# Patient Record
Sex: Female | Born: 1935
Health system: Southern US, Community
[De-identification: ages and names within clinical notes are randomized; demographics above are authoritative.]

## PROBLEM LIST (undated history)

## (undated) DIAGNOSIS — E785 Hyperlipidemia, unspecified: Secondary | ICD-10-CM

## (undated) DIAGNOSIS — K449 Diaphragmatic hernia without obstruction or gangrene: Secondary | ICD-10-CM

## (undated) DIAGNOSIS — M1990 Unspecified osteoarthritis, unspecified site: Secondary | ICD-10-CM

## (undated) DIAGNOSIS — K219 Gastro-esophageal reflux disease without esophagitis: Secondary | ICD-10-CM

## (undated) HISTORY — DX: Hyperlipidemia, unspecified: E78.5

## (undated) HISTORY — DX: Unspecified osteoarthritis, unspecified site: M19.90

## (undated) HISTORY — PX: CATARACT EXTRACTION: SUR2

## (undated) HISTORY — DX: Gastro-esophageal reflux disease without esophagitis: K21.9

---

## 1960-03-27 HISTORY — PX: APPENDECTOMY: SHX54

## 1968-03-27 HISTORY — PX: BREAST BIOPSY: SHX20

## 2006-06-04 ENCOUNTER — Ambulatory Visit: Payer: Self-pay | Admitting: Ophthalmology

## 2006-06-11 ENCOUNTER — Ambulatory Visit: Payer: Self-pay | Admitting: Ophthalmology

## 2009-12-31 ENCOUNTER — Encounter: Payer: Self-pay | Admitting: Internal Medicine

## 2010-01-31 ENCOUNTER — Encounter: Payer: Self-pay | Admitting: Internal Medicine

## 2010-03-15 ENCOUNTER — Encounter: Payer: Self-pay | Admitting: Internal Medicine

## 2010-03-15 ENCOUNTER — Ambulatory Visit: Payer: Self-pay | Admitting: Gastroenterology

## 2010-03-15 LAB — HM COLONOSCOPY: HM Colonoscopy: 2

## 2010-03-16 LAB — PATHOLOGY REPORT

## 2010-03-23 ENCOUNTER — Encounter: Payer: Self-pay | Admitting: Internal Medicine

## 2010-03-23 ENCOUNTER — Ambulatory Visit: Payer: Self-pay | Admitting: Gastroenterology

## 2010-03-23 IMAGING — CT CT ABD-PELV W/ CM
1 of 2 series · 15 of 32 positions shown, 19 images · IV contrast (isovue)
Comparison: None

REASON FOR EXAM: right sided abdominal pain
COMMENTS:

PROCEDURE:     CT  - CT ABDOMEN / PELVIS  W  - [DATE]  [DATE]
RESULT:     History: Pain
TECHNIQUE: Multiple axial images of the abdomen and pelvis were performed
from the lung bases to the pubic symphysis, with p.o. contrast and with 85
mL of Isovue 370 intravenous contrast.

[Series 2: soft tissue · axial · 0.75mm/px · z∈[+71,+436]mm · 15 of 81 slices shown, 19 images]
[im 4/81  soft-tissue]
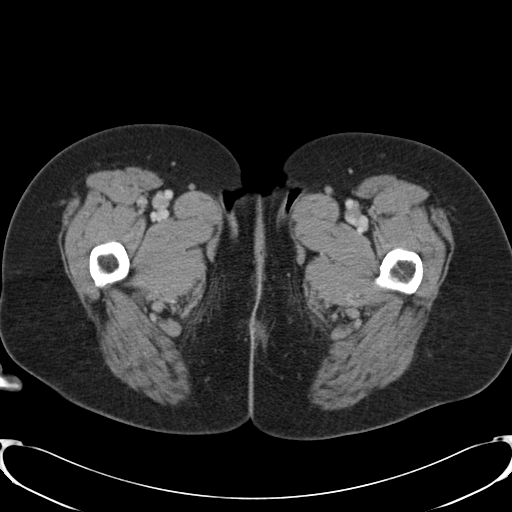
[im 4/81  bone]
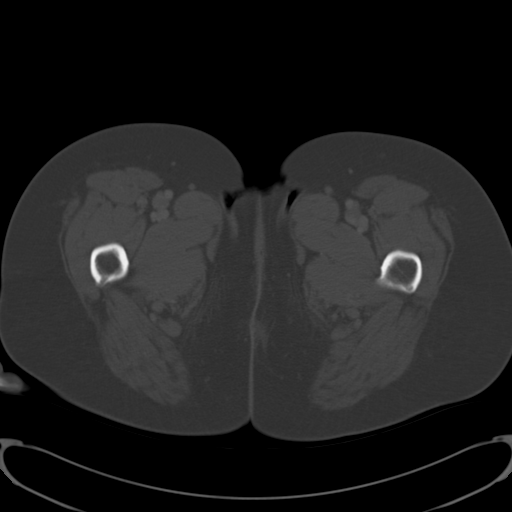
[im 10/81  soft-tissue]
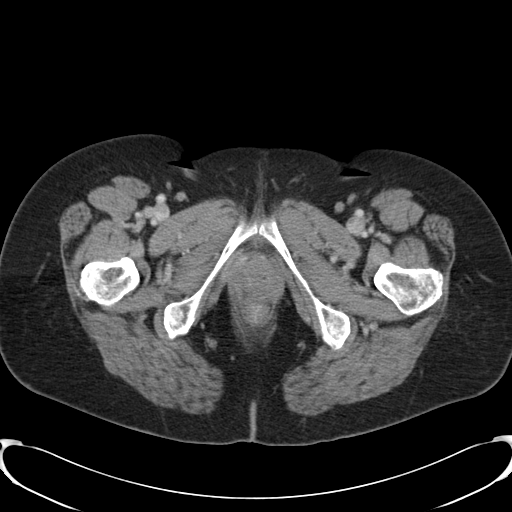
[im 16/81  soft-tissue]
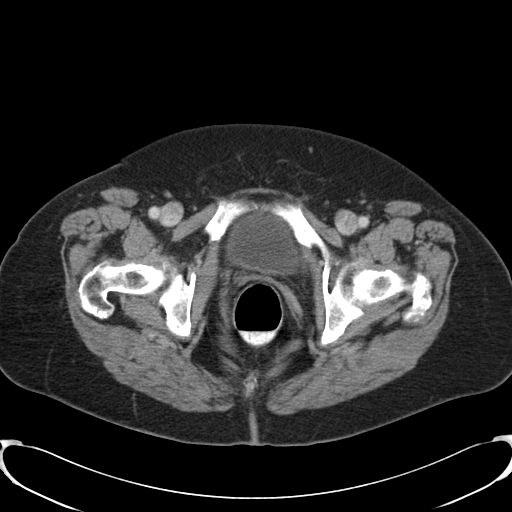
[im 22/81  soft-tissue]
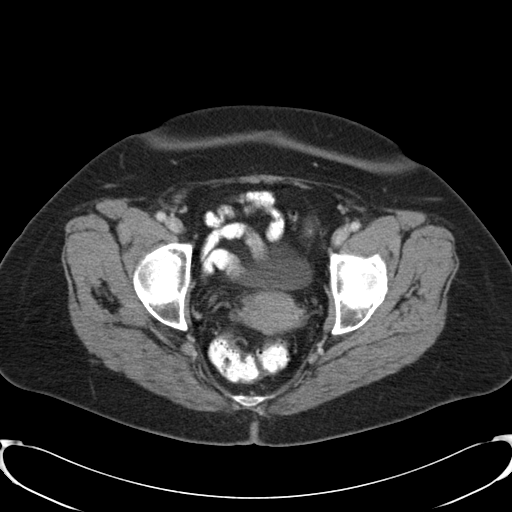
[im 28/81  soft-tissue]
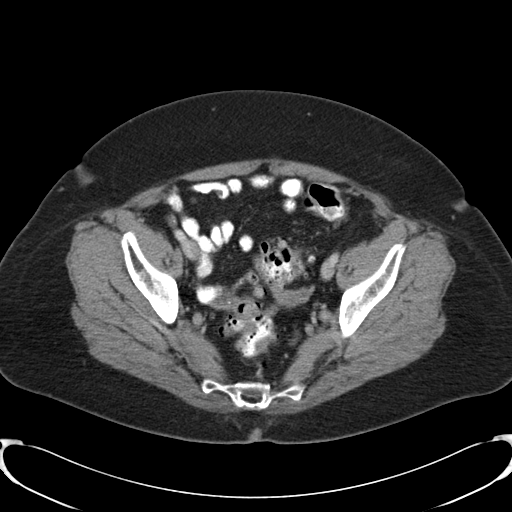
[im 34/81  soft-tissue]
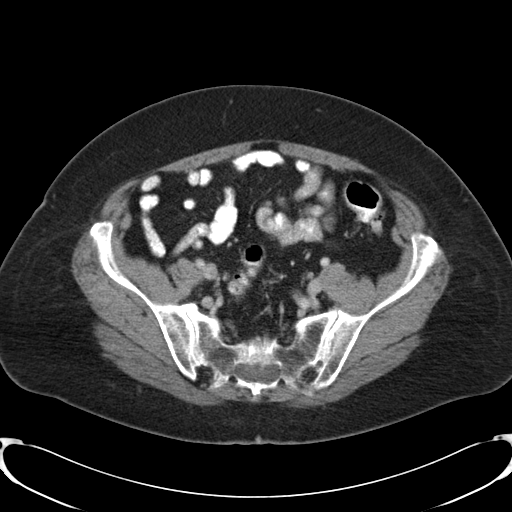
[im 41/81  soft-tissue]
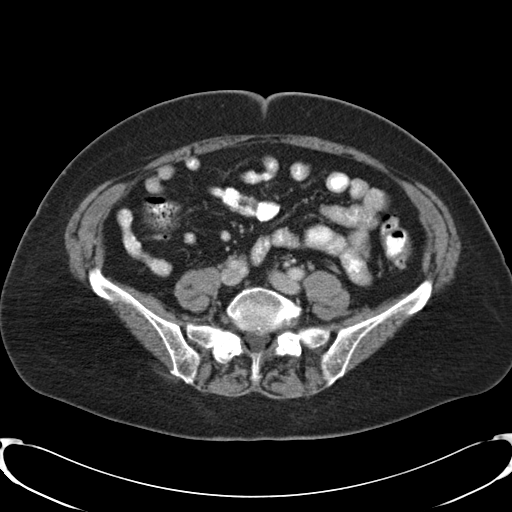
[im 47/81  soft-tissue]
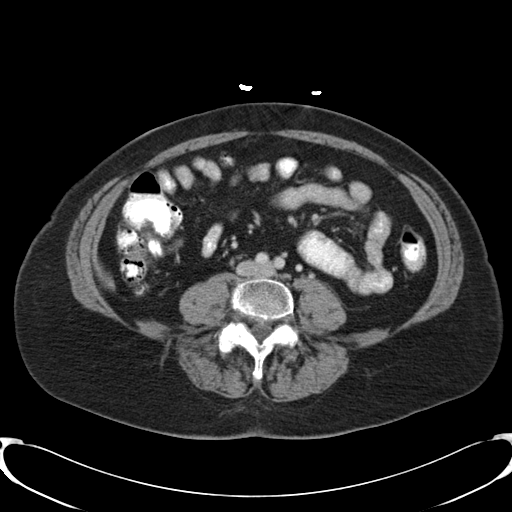
[im 53/81  soft-tissue]
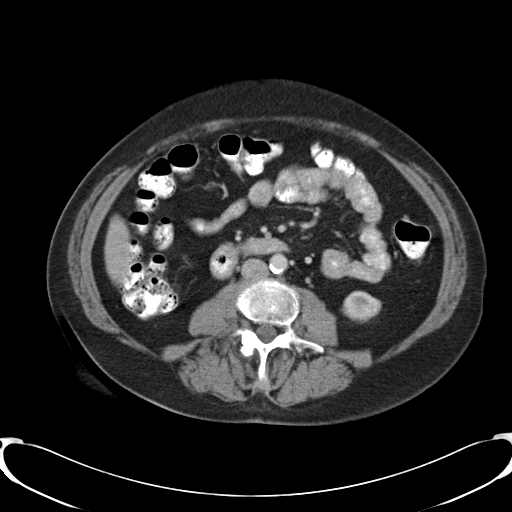
[im 53/81  bone]
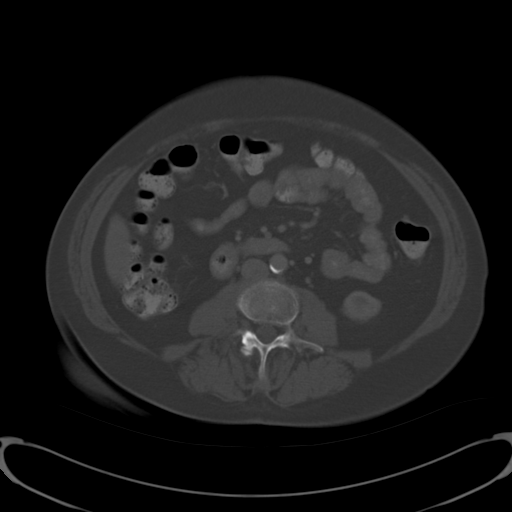
[im 59/81  soft-tissue]
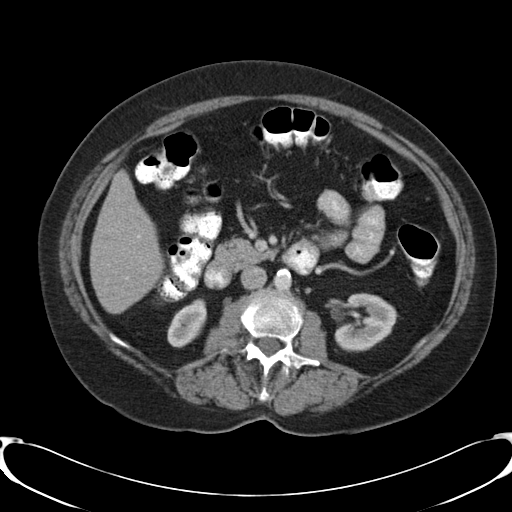
[im 65/81  soft-tissue]
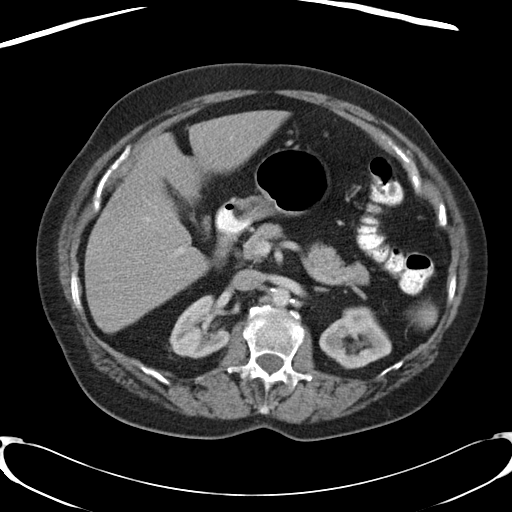
[im 68/81  lung]
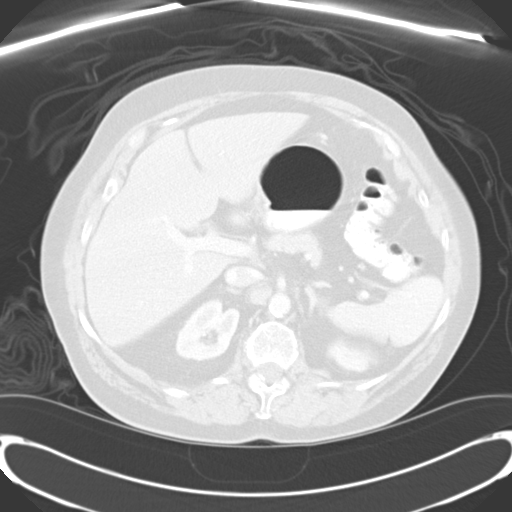
[im 71/81  soft-tissue]
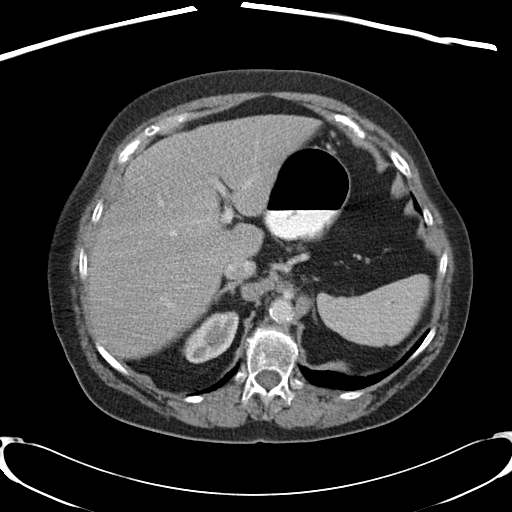
[im 71/81  lung]
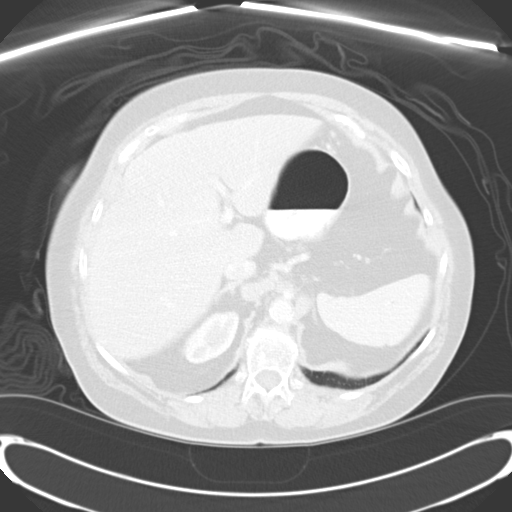
[im 74/81  lung]
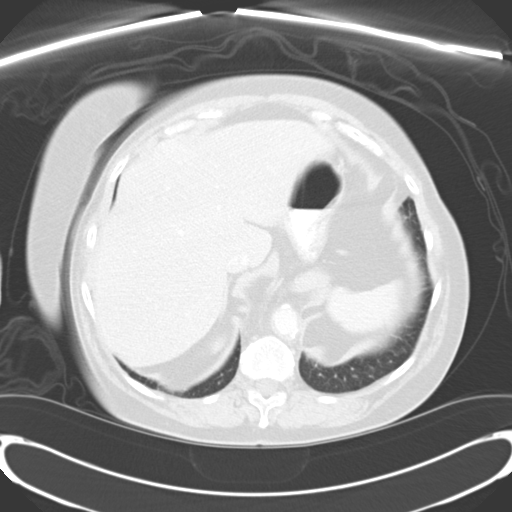
[im 77/81  soft-tissue]
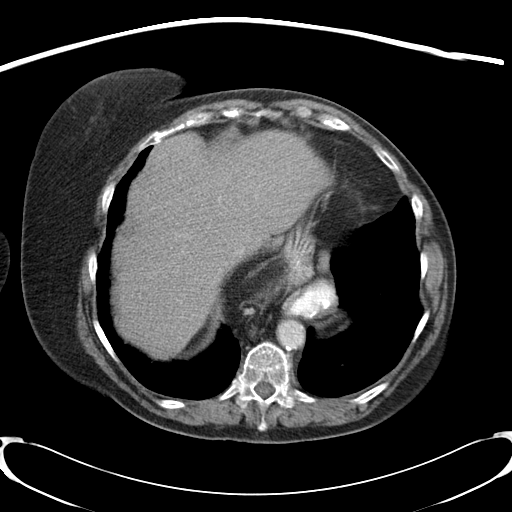
[im 77/81  lung]
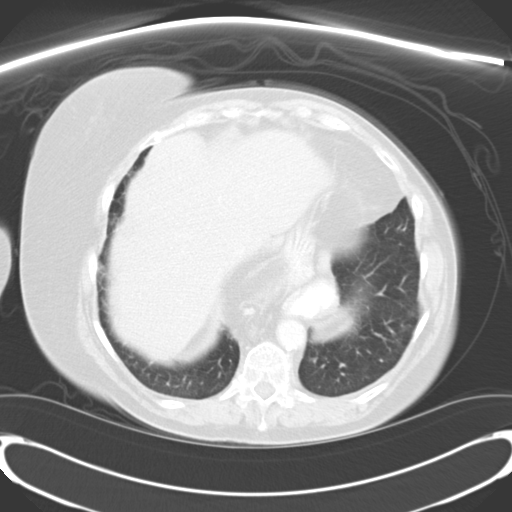

[15 of 32 positions shown; findings below may reference images not displayed]

FINDINGS: The lung bases are clear. There is no pneumothorax. The heart size is
normal.

The liver demonstrates no focal abnormality. There is no intrahepatic or
extrahepatic biliary ductal dilatation. The gallbladder is unremarkable. The
spleen demonstrates no focal abnormality. The kidneys, adrenal glands, and
pancreas are normal. The bladder is unremarkable.

The stomach, duodenum, small intestine, and large intestine demonstrate no
contrast extravasation or dilatation. There is a large hiatal hernia. There
is diverticulosis without evidence of diverticulitis. There is no
pneumoperitoneum, pneumatosis, or portal venous gas. There is no abdominal
or pelvic free fluid. There is no lymphadenopathy.

The abdominal aorta is normal in caliber with atherosclerosis.

The osseous structures are unremarkable.
IMPRESSION: 1. No acute abdominal or pelvic pathology.
2. Large hiatal hernia.

## 2010-04-13 ENCOUNTER — Ambulatory Visit
Admission: RE | Admit: 2010-04-13 | Discharge: 2010-04-13 | Payer: Self-pay | Source: Home / Self Care | Attending: Internal Medicine | Admitting: Internal Medicine

## 2010-04-13 DIAGNOSIS — E785 Hyperlipidemia, unspecified: Secondary | ICD-10-CM | POA: Insufficient documentation

## 2010-04-13 DIAGNOSIS — M1990 Unspecified osteoarthritis, unspecified site: Secondary | ICD-10-CM | POA: Insufficient documentation

## 2010-04-13 DIAGNOSIS — K219 Gastro-esophageal reflux disease without esophagitis: Secondary | ICD-10-CM | POA: Insufficient documentation

## 2010-04-28 NOTE — Letter (Signed)
Summary: [facility]  [facility]   Imported By: Maryln Gottron 04/19/2010 10:51:28  _____________________________________________________________________  External Attachment:    Type:   Image     Comment:   External Document

## 2010-04-28 NOTE — Assessment & Plan Note (Signed)
Summary: new medicare patient/[facility]   Vital Signs:  Patient profile:   77 year old female Height:      61 inches Weight:      156 pounds BMI:     29.58 Temp:     97.6 degrees F oral Pulse rate:   76 / minute Pulse rhythm:   regular BP sitting:   154 / 75  (left arm) Cuff size:   regular  Vitals Entered By: Mervin Hack CMA Duncan Dull) (April 13, 2010 11:43 AM) CC: new patient to establish care   History of Present Illness: Here to establish I care for her daughter  Mild arthritis ---hands and knees generally does okay without meds Uses biofreeze  Mild reflux problems past ulcer but quiet hasn't needed meds of late  past anemia last blood work was normal---records she brought were reviewed Past iron Rx but not recently  High cholesterol Never been on Rx Tries to walk regularly  Preventive Screening-Counseling & Management  Alcohol-Tobacco     Smoking Status: never  Allergies (verified): 1)  ! * Egg Whites  Past History:  Past Medical History: GERD Hyperlipidemia Osteoarthritis  Past Surgical History: Appendectomy/with ruptured ovary repair-- 1962 Breast biopsy  1970 Cataract extraction--both eyes  Family History: Mom died @96  old age. Slgiht HTN Dad died of pneumonia @87  2 brothers-- 1 died of lung disease. 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 Requests husband to make decisions if needed--then daughter. Would accept CPR at this point, but no prolonged artificial ventilation. Would not want tube feedsOccupation:  employed Smoking Status:  never  Review of Systems General:  weight is stable sleeps okay wears seat belt. Eyes:  Denies double vision and vision loss-1 eye. ENT:  Complains of ringing in ears; denies decreased hearing; chroinic tinnitus  but hearing okay Full upper denture--does see dentist. CV:  Complains of palpitations; denies chest pain or discomfort, difficulty breathing at night, difficulty breathing while lying down, fainting, and shortness of breath with exertion; Palpitations in past--gone now. Resp:  Denies cough and shortness of breath. GI:  Complains of indigestion; denies abdominal pain, bloody stools, change in bowel habits, dark tarry stools, nausea, and vomiting; rare heartburn. GU:  Denies dysuria and incontinence; last mammo about 4 years ago---prefers not doing them now. MS:  Complains of joint pain; denies joint swelling. Derm:  Denies lesion(s) and rash. Neuro:  Denies headaches, numbness, tingling, and weakness. Psych:  Denies anxiety and depression. Heme:  Denies abnormal bruising and enlarge lymph nodes. Allergy:  Denies seasonal allergies and sneezing.  Contraindications/Deferment of Procedures/Staging:    Test/Procedure: Mammogram    Reason for deferment: patient declined   Physical Exam  General:  alert and normal appearance.   Eyes:  pupils equal, pupils round, and pupils reactive to light.   Mouth:  no erythema, no exudates, and no lesions.   Neck:  supple, no masses, no thyromegaly, no carotid bruits, and no cervical lymphadenopathy.   Lungs:  normal respiratory effort, no intercostal retractions, no accessory muscle use, and normal breath sounds.   Heart:  normal rate, regular rhythm, no murmur, and no gallop.   Abdomen:  soft, non-tender, and no masses.   Msk:  no joint tenderness.  Mild nodules on hands---DIP and PIP Pulses:  1+ in feet Extremities:  no  edema Mild bunions bilaterally Neurologic:  alert & oriented X3, strength normal in all extremities, and gait normal.   Skin:  no rashes and no suspicious lesions.   Psych:  normally interactive, good eye contact, not anxious appearing, and not depressed appearing.     Impression & Recommendations:  Problem # 1:  OSTEOARTHRITIS  (ICD-715.90) Assessment Comment Only mild satisfied with biofreeze discussed fitness and regular exercise  Problem # 2:  GERD (ICD-530.81) Assessment: Unchanged mild symptoms past ulcer which has been quiet no regular meds  Problem # 3:  HYPERLIPIDEMIA (ICD-272.4) Assessment: Comment Only total chol 307 recently no other sig risk factors no sig FH  discussed rationale of primary prevention and both agreed that treatment with meds was not indicated  Patient Instructions: 1)  Please schedule a follow-up appointment in 1-2 years for Medicare Wellness exam   Orders Added: 1)  New Patient Level IV [04540]    Prior Medications: None Current Allergies (reviewed today): ! * EGG WHITES   Colonoscopy  Procedure date:  03/15/2010  Findings:      2mm hyperplastic polyp Diverticulosis Dr Marva Panda

## 2010-04-28 NOTE — Procedures (Signed)
Summary: Colonoscopy Report/[facility]  Colonoscopy Report/[facility]   Imported By: Maryln Gottron 04/19/2010 10:46:43  _____________________________________________________________________  External Attachment:    Type:   Image     Comment:   External Document

## 2010-08-14 ENCOUNTER — Emergency Department: Payer: Self-pay | Admitting: Emergency Medicine

## 2010-08-15 IMAGING — CR DG KNEE COMPLETE 4+V*R*
1 series · 4 of 4 positions shown · non-contrast
Comparison: none

REASON FOR EXAM: right knee pain
COMMENTS:

PROCEDURE:     DXR - DXR KNEE RT COMP WITH OBLIQUES  - [DATE] [DATE]
RESULT:     Images the right knee demonstrate degenerative joint disease
without a definite fracture, dislocation or radiopaque foreign body.

[Series 1: view not recorded · 0.17mm/px · 4 of 4 slices shown]
[im 1/4]
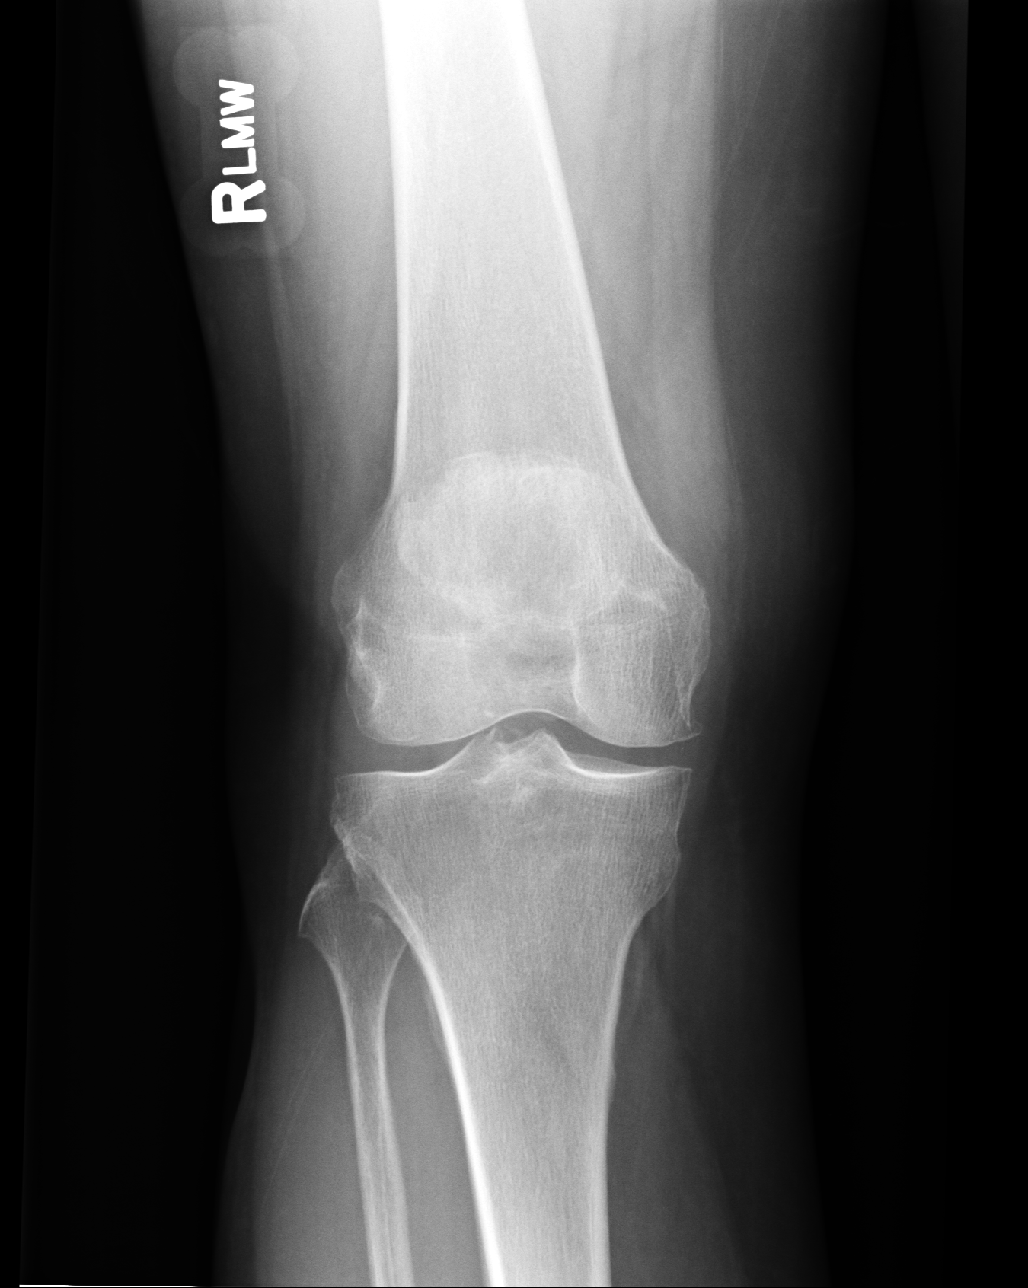
[im 2/4]
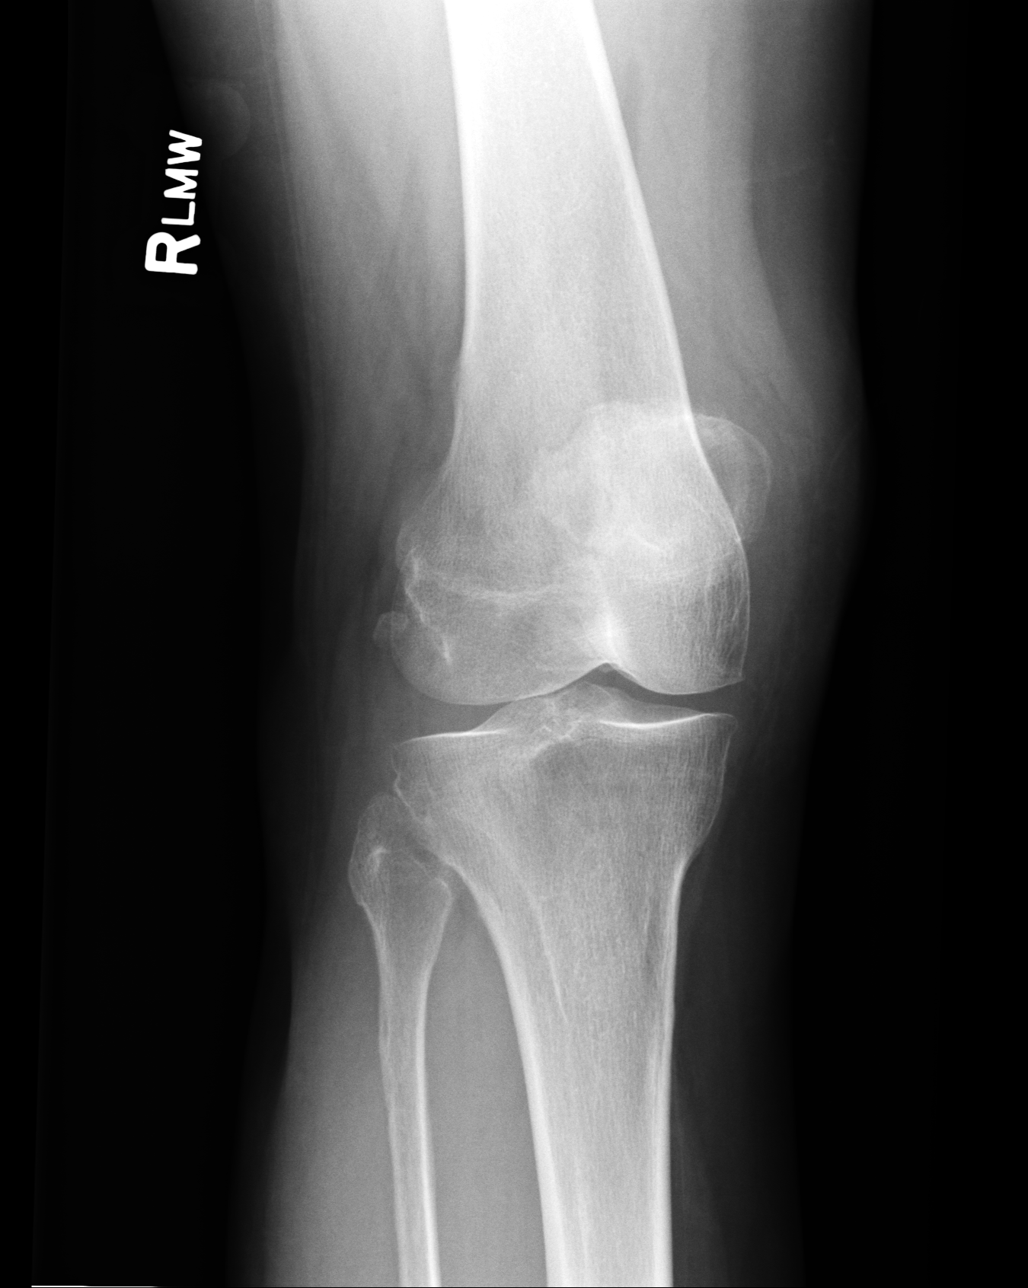
[im 3/4]
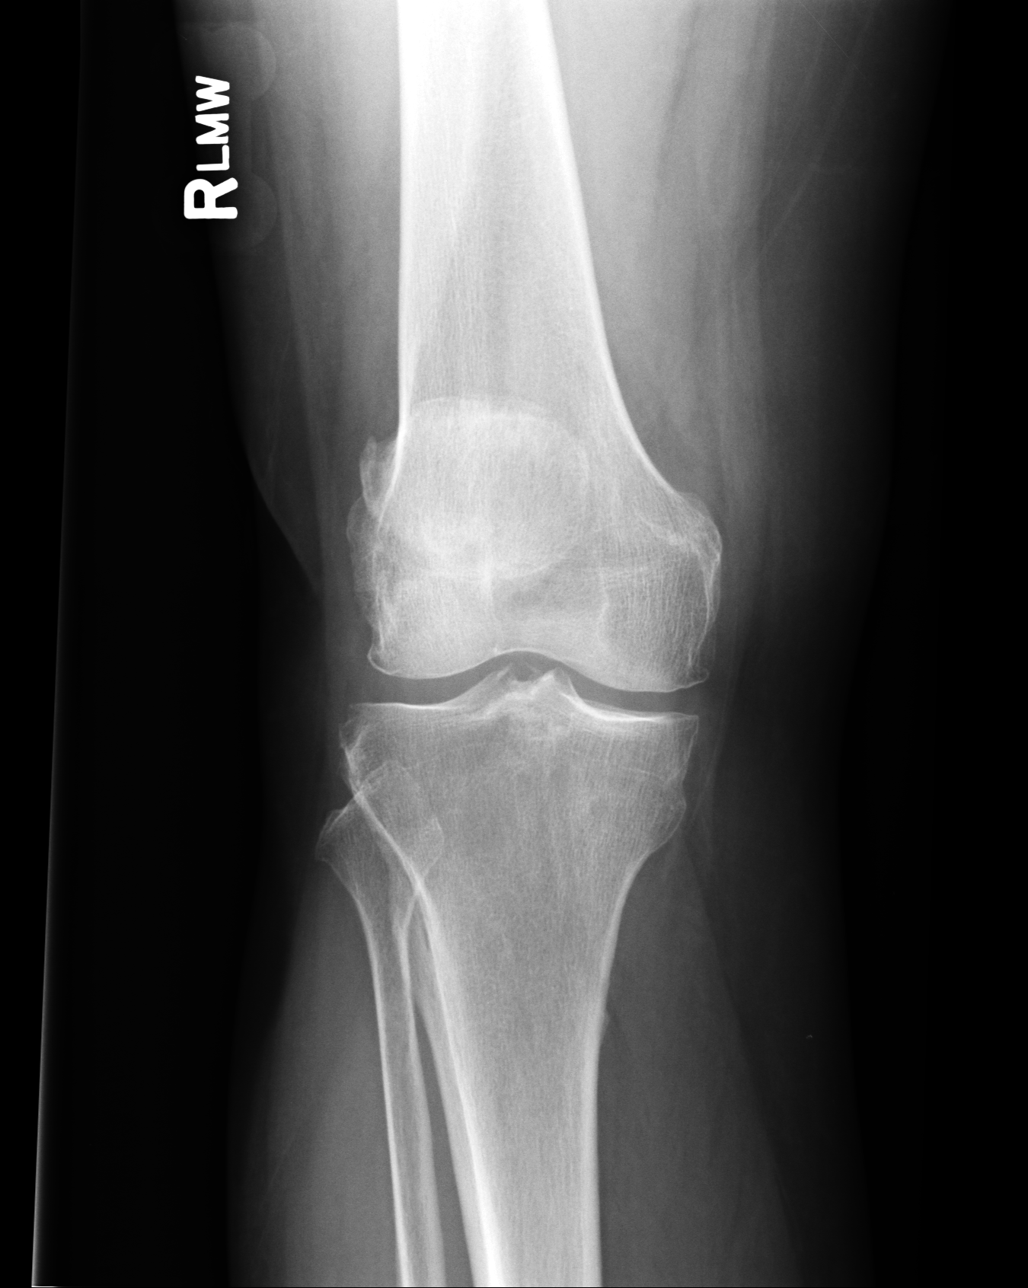
[im 4/4]
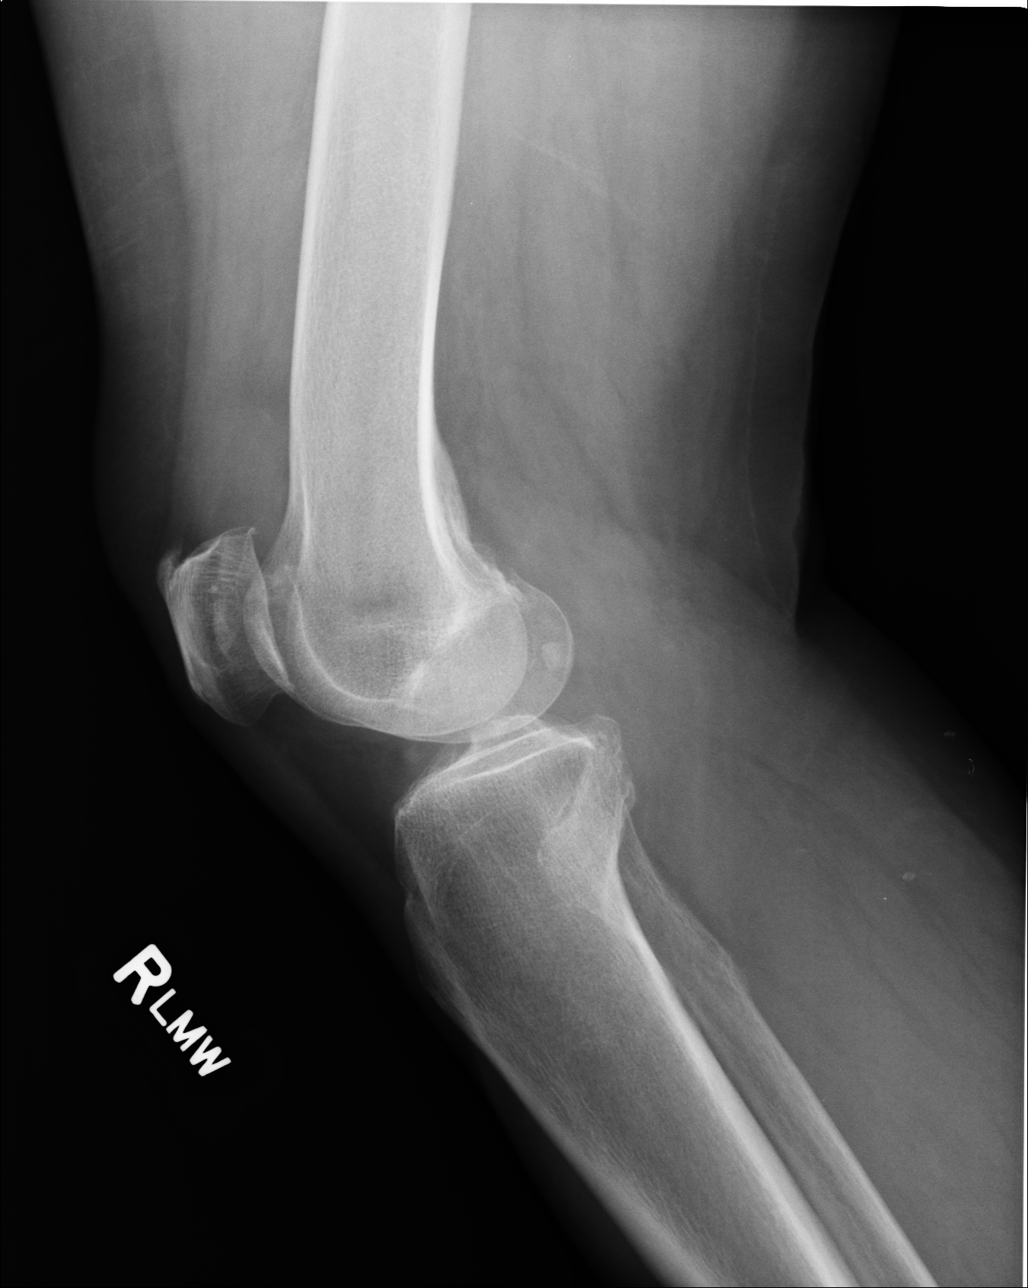

[4 of 4 positions shown; findings below may reference images not displayed]

IMPRESSION: Please see above.

## 2010-10-31 ENCOUNTER — Encounter: Payer: Self-pay | Admitting: Internal Medicine

## 2010-10-31 ENCOUNTER — Ambulatory Visit (INDEPENDENT_AMBULATORY_CARE_PROVIDER_SITE_OTHER): Payer: Medicare Other | Admitting: Internal Medicine

## 2010-10-31 DIAGNOSIS — R609 Edema, unspecified: Secondary | ICD-10-CM

## 2010-10-31 DIAGNOSIS — M1990 Unspecified osteoarthritis, unspecified site: Secondary | ICD-10-CM

## 2010-10-31 DIAGNOSIS — R030 Elevated blood-pressure reading, without diagnosis of hypertension: Secondary | ICD-10-CM

## 2010-10-31 LAB — CBC WITH DIFFERENTIAL/PLATELET
Basophils Absolute: 0.1 10*3/uL (ref 0.0–0.1)
Basophils Relative: 1 % (ref 0.0–3.0)
Eosinophils Absolute: 0.6 10*3/uL (ref 0.0–0.7)
MCHC: 32 g/dL (ref 30.0–36.0)
MCV: 75.5 fl — ABNORMAL LOW (ref 78.0–100.0)
Monocytes Absolute: 0.9 10*3/uL (ref 0.1–1.0)
Neutro Abs: 5.6 10*3/uL (ref 1.4–7.7)
Neutrophils Relative %: 61.7 % (ref 43.0–77.0)
RBC: 3.64 Mil/uL — ABNORMAL LOW (ref 3.87–5.11)
RDW: 16.5 % — ABNORMAL HIGH (ref 11.5–14.6)

## 2010-10-31 LAB — BASIC METABOLIC PANEL
CO2: 25 mEq/L (ref 19–32)
Calcium: 8.6 mg/dL (ref 8.4–10.5)
Chloride: 102 mEq/L (ref 96–112)
Creatinine, Ser: 1.4 mg/dL — ABNORMAL HIGH (ref 0.4–1.2)
Glucose, Bld: 107 mg/dL — ABNORMAL HIGH (ref 70–99)

## 2010-10-31 LAB — HEPATIC FUNCTION PANEL
Albumin: 4 g/dL (ref 3.5–5.2)
Total Protein: 6.6 g/dL (ref 6.0–8.3)

## 2010-10-31 NOTE — Assessment & Plan Note (Signed)
New problem Sounds like venous insufficiency Better now Still taking aleve daily though now at night---will check labs just in case Has cut back on yard work in the heat and trying to limit salt

## 2010-10-31 NOTE — Progress Notes (Signed)
  Subjective:    Patient ID: Sherry Phillips, female    DOB: Nov 13, 1933, 77 y.o.   MRN: [id number]  HPI Has been concerned about her legs Knows she has arthritis in her right knee Noted daytime swelling in foot and leg Told to take aleve by [facility] ortho (where daughter works) Then noted increased leg aching at end of day and swelling  Changed aleve to one at bedtime and seems to be doing better Has reduced her time on her feet working in the yard  Has cut back on salt but still uses some  Had been able to be active without pain in daytime--only hurt when supine at night This is better since changing aleve to evening  No current outpatient prescriptions on file prior to visit.    No Known Allergies  Past Medical History  Diagnosis Date  . GERD (gastroesophageal reflux disease)   . Hyperlipidemia   . Osteoarthritis     Past Surgical History  Procedure Date  . Appendectomy 1962    with ruptured ovary repair  . Breast biopsy 1970  . Cataract extraction     both eyes    Family History  Problem Relation Age of Onset  . Hypertension Mother   . Diabetes Sister     borderline  . Coronary artery disease Brother   . Lung disease Brother   . Cancer Neg Hx     no breast or colon cancer    History   Social History  . Marital Status: Married    Spouse Name: N/A    Number of Children: 2  . Years of Education: N/A   Occupational History  . homemaker    Social History Main Topics  . Smoking status: Never Smoker   . Smokeless tobacco: Not on file  . Alcohol Use: No  . Drug Use: Not on file  . Sexually Active: Not on file   Other Topics Concern  . 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  Requests husband to make decisions if needed- then daughter.  Would accept CPR at this point, but no prolonged artificial ventilation.  Would not want tube feeds.   Review of Systems No  chest pain No SOB    Objective:   Physical Exam  Constitutional: She appears well-developed and well-nourished. No distress.  Neck: Normal range of motion. Neck supple. No thyromegaly present.  Cardiovascular: Normal rate, regular rhythm, normal heart sounds and intact distal pulses.  Exam reveals no gallop.   No murmur heard. Pulmonary/Chest: Effort normal and breath sounds normal. No respiratory distress. She has no wheezes. She has no rales.  Abdominal: Soft. There is no tenderness.  Musculoskeletal: Normal range of motion. She exhibits no edema and no tenderness.       homan's is negative   Lymphadenopathy:    She has no cervical adenopathy.  Skin: No rash noted. No erythema.  Psychiatric: She has a normal mood and affect. Her behavior is normal. Judgment and thought content normal.          Assessment & Plan:

## 2010-10-31 NOTE — Assessment & Plan Note (Signed)
Mostly right knee Seen by ortho at [facility] No surgery for now If renal function normal, okay to continue aleve once a day

## 2010-10-31 NOTE — Assessment & Plan Note (Signed)
BP Readings from Last 3 Encounters:  10/31/10 166/72  04/13/10 154/75   Up more today but has been stressed with husband Will check labs Recheck in a few months---consider diuretic

## 2010-11-01 ENCOUNTER — Telehealth: Payer: Self-pay | Admitting: *Deleted

## 2010-11-01 NOTE — Telephone Encounter (Signed)
Let her know that i think it is still related to the aleve  Please add H. Pylori test to upcoming blood work though as she should get antibiotics for this if it is positive

## 2010-11-01 NOTE — Telephone Encounter (Signed)
Pt's daughter wanted you to be aware that pt does have a family history of bleeding ulcers- her father, sister had one, pt herself has had one.

## 2010-11-02 NOTE — Telephone Encounter (Signed)
Spoke with patient and advised results Test added to labs

## 2010-11-10 ENCOUNTER — Other Ambulatory Visit (INDEPENDENT_AMBULATORY_CARE_PROVIDER_SITE_OTHER): Payer: Medicare Other | Admitting: Internal Medicine

## 2010-11-10 DIAGNOSIS — D649 Anemia, unspecified: Secondary | ICD-10-CM

## 2010-11-10 LAB — CBC WITH DIFFERENTIAL/PLATELET
Basophils Absolute: 0.1 10*3/uL (ref 0.0–0.1)
Eosinophils Relative: 4.6 % (ref 0.0–5.0)
HCT: 31.3 % — ABNORMAL LOW (ref 36.0–46.0)
Hemoglobin: 9.8 g/dL — ABNORMAL LOW (ref 12.0–15.0)
Lymphocytes Relative: 18.3 % (ref 12.0–46.0)
Lymphs Abs: 1.8 10*3/uL (ref 0.7–4.0)
Monocytes Relative: 8.7 % (ref 3.0–12.0)
Neutro Abs: 6.7 10*3/uL (ref 1.4–7.7)
Platelets: 491 10*3/uL — ABNORMAL HIGH (ref 150.0–400.0)
WBC: 9.9 10*3/uL (ref 4.5–10.5)

## 2010-11-10 LAB — FERRITIN: Ferritin: 22.5 ng/mL (ref 10.0–291.0)

## 2010-11-10 LAB — BASIC METABOLIC PANEL
BUN: 13 mg/dL (ref 6–23)
Chloride: 103 mEq/L (ref 96–112)
Creatinine, Ser: 1.4 mg/dL — ABNORMAL HIGH (ref 0.4–1.2)
GFR: 39.58 mL/min — ABNORMAL LOW (ref >60.00)

## 2010-11-10 LAB — IBC PANEL
Iron: 26 ug/dL — ABNORMAL LOW (ref 42–145)
Saturation Ratios: 6.6 % — ABNORMAL LOW (ref 20.0–50.0)

## 2010-11-11 ENCOUNTER — Ambulatory Visit: Payer: Self-pay | Admitting: Internal Medicine

## 2010-11-11 ENCOUNTER — Other Ambulatory Visit: Payer: Self-pay | Admitting: Internal Medicine

## 2010-11-11 LAB — H. PYLORI ANTIBODY, IGG: H Pylori IgG: POSITIVE

## 2010-11-11 MED ORDER — CLARITHROMYCIN 500 MG PO TABS: 500.0000 mg | ORAL_TABLET | Freq: Two times a day (BID) | ORAL | Status: AC

## 2010-11-11 MED ORDER — AMOXICILLIN 500 MG PO TABS: 1000.0000 mg | ORAL_TABLET | Freq: Two times a day (BID) | ORAL | Status: AC

## 2010-11-16 ENCOUNTER — Telehealth: Payer: Self-pay | Admitting: *Deleted

## 2010-11-16 NOTE — Telephone Encounter (Signed)
Spoke with patient and advised results  she didn't have any questions

## 2010-11-16 NOTE — Telephone Encounter (Signed)
Pt was recently started on iron, prilosec, biaxin and amox.  She says she started feeling a lump in her throat and burning in her throat yesterday and is asking if one of these meds could be causing this.  Also, she says the biaxin tablets are very large and she has to cut these in half, I told her that's ok.

## 2010-11-16 NOTE — Telephone Encounter (Signed)
Probably the biaxin but have her try off the iron first If no better after 1 day, try off the biaxin

## 2010-11-17 NOTE — Telephone Encounter (Signed)
Pt's daughter states lump in pt's throat is a little better today.  She has not had any antibiotics for 2 days.  Daughter states pt took iron for years in the past without any problems and took zantac without any problem.  Daughter is asking why pt was told to stop zantac and start prilosec.  Ok to restart iron and stay off antibiotics?  Daughter is Chancy Hurter, phone [phone] today or [phone] tomorrow.

## 2010-11-17 NOTE — Telephone Encounter (Signed)
There is a regimen of antibiotics and acid blockers (like prilosec but not zantac) that are used to get rid of the ulcer bacteria If she is better now, she should continue the prilosec and restart the amoxicillin to finish out the 2 weeks. Adding pepto bismol if she can tolerate it may help get rid of the bacteria (like a teaspoon tid for the rest of the 2 week Rx) Okay to restart iron but probably should wait till done with the amoxicillin and pepto bismol

## 2010-11-18 ENCOUNTER — Other Ambulatory Visit: Payer: Self-pay | Admitting: *Deleted

## 2010-11-22 NOTE — Telephone Encounter (Signed)
Spoke with patient and advised results   

## 2011-03-06 ENCOUNTER — Ambulatory Visit: Payer: Medicare Other | Admitting: Internal Medicine

## 2011-05-03 ENCOUNTER — Ambulatory Visit (INDEPENDENT_AMBULATORY_CARE_PROVIDER_SITE_OTHER): Payer: Medicare Other | Admitting: Internal Medicine

## 2011-05-03 ENCOUNTER — Encounter: Payer: Self-pay | Admitting: Internal Medicine

## 2011-05-03 VITALS — BP 150/80 | HR 102 | Temp 97.7°F | Ht 61.0 in | Wt 158.0 lb

## 2011-05-03 DIAGNOSIS — B9681 Helicobacter pylori [H. pylori] as the cause of diseases classified elsewhere: Secondary | ICD-10-CM | POA: Insufficient documentation

## 2011-05-03 DIAGNOSIS — R42 Dizziness and giddiness: Secondary | ICD-10-CM

## 2011-05-03 DIAGNOSIS — H8190 Unspecified disorder of vestibular function, unspecified ear: Secondary | ICD-10-CM

## 2011-05-03 DIAGNOSIS — K2970 Gastritis, unspecified, without bleeding: Secondary | ICD-10-CM

## 2011-05-03 DIAGNOSIS — A048 Other specified bacterial intestinal infections: Secondary | ICD-10-CM

## 2011-05-03 DIAGNOSIS — R609 Edema, unspecified: Secondary | ICD-10-CM

## 2011-05-03 LAB — CBC WITH DIFFERENTIAL/PLATELET
Basophils Absolute: 0.1 10*3/uL (ref 0.0–0.1)
Eosinophils Absolute: 0.4 10*3/uL (ref 0.0–0.7)
HCT: 43.4 % (ref 36.0–46.0)
Hemoglobin: 14.8 g/dL (ref 12.0–15.0)
Lymphocytes Relative: 19.7 % (ref 12.0–46.0)
Lymphs Abs: 1.8 10*3/uL (ref 0.7–4.0)
MCHC: 34.2 g/dL (ref 30.0–36.0)
Monocytes Relative: 8.3 % (ref 3.0–12.0)
Neutro Abs: 6 10*3/uL (ref 1.4–7.7)
Platelets: 297 10*3/uL (ref 150.0–400.0)
RDW: 15.2 % — ABNORMAL HIGH (ref 11.5–14.6)

## 2011-05-03 LAB — BASIC METABOLIC PANEL
BUN: 16 mg/dL (ref 6–23)
CO2: 27 mEq/L (ref 19–32)
Calcium: 9.4 mg/dL (ref 8.4–10.5)
GFR: 45.57 mL/min — ABNORMAL LOW (ref >60.00)
Glucose, Bld: 90 mg/dL (ref 70–99)

## 2011-05-03 LAB — TSH: TSH: 2.68 u[IU]/mL (ref 0.35–5.50)

## 2011-05-03 LAB — HEPATIC FUNCTION PANEL
Albumin: 4.1 g/dL (ref 3.5–5.2)
Alkaline Phosphatase: 78 U/L (ref 39–117)
Total Protein: 7 g/dL (ref 6.0–8.3)

## 2011-05-03 MED ORDER — MECLIZINE HCL 25 MG PO TABS: 12.5000 mg | ORAL_TABLET | Freq: Three times a day (TID) | ORAL | Status: DC | PRN

## 2011-05-03 NOTE — Assessment & Plan Note (Signed)
Doesn't seem cardiovascular Related to her noise in ear Not severe Discussed Will give Rx for meclizine prn

## 2011-05-03 NOTE — Assessment & Plan Note (Signed)
Symptoms now resolved after treatment course

## 2011-05-03 NOTE — Progress Notes (Signed)
Subjective:    Patient ID: Sherry Phillips, female    DOB: 13-Oct-1933, 77 y.o.   MRN: [id number]  HPI Has had several weeks of dizziness  Wonders about inner ear since she notes it if she looks up or bends down--or if she turns fast Has sense she is "walking sideways" No true rotatory vertigo Not a sense like she is going to pass out Feels weak at those times If careful holding head steady, she does okay When it occurs, it goes away quickly  Took the antibiotics and omeprazole Stomach symptoms are better  Edema resolved No chest pain No SOB  Current Outpatient Prescriptions on File Prior to Visit  Medication Sig Dispense Refill  . naproxen sodium (ANAPROX) 220 MG tablet Take one by mouth at bedtime       . ranitidine (ZANTAC) 150 MG tablet Take 150 mg by mouth at bedtime.          No Known Allergies  Past Medical History  Diagnosis Date  . GERD (gastroesophageal reflux disease)   . Hyperlipidemia   . Osteoarthritis     Past Surgical History  Procedure Date  . Appendectomy 1962    with ruptured ovary repair  . Breast biopsy 1970  . Cataract extraction     both eyes    Family History  Problem Relation Age of Onset  . Hypertension Mother   . Diabetes Sister     borderline  . Coronary artery disease Brother   . Lung disease Brother   . Cancer Neg Hx     no breast or colon cancer    History   Social History  . Marital Status: Married    Spouse Name: N/A    Number of Children: 2  . Years of Education: N/A   Occupational History  . homemaker    Social History Main Topics  . Smoking status: Never Smoker   . Smokeless tobacco: Never Used  . Alcohol Use: No  . Drug Use: Not on file  . Sexually Active: Not on file   Other Topics Concern  . 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  Requests husband to make decisions if needed- then daughter.  Would accept CPR  at this point, but no prolonged artificial ventilation.  Would not want tube feeds.   Review of Systems Voice off a little and nose burns when out in cold Has loud sound in right ear Not sure about her hearing    Objective:   Physical Exam  Constitutional: She appears well-developed and well-nourished. No distress.  HENT:  Mouth/Throat: Oropharynx is clear and moist. No oropharyngeal exudate.       Cerumen in right canal  Eyes: Conjunctivae and EOM are normal. Pupils are equal, round, and reactive to light.       No nystagmus  Neck: Normal range of motion. Neck supple. No thyromegaly present.  Cardiovascular: Normal rate, regular rhythm and normal heart sounds.   Pulmonary/Chest: Effort normal and breath sounds normal. No respiratory distress. She has no wheezes. She has no rales.  Musculoskeletal: She exhibits no edema and no tenderness.  Lymphadenopathy:    She has no cervical adenopathy.  Neurological: She is alert. She has normal strength. No cranial nerve deficit. She exhibits normal muscle tone. She displays a negative Romberg sign. Coordination and gait normal.  Psychiatric: She has a normal mood and affect. Her  behavior is normal. Judgment and thought content normal.          Assessment & Plan:

## 2011-05-03 NOTE — Assessment & Plan Note (Signed)
Improved Will recheck labs

## 2011-05-05 ENCOUNTER — Encounter: Payer: Self-pay | Admitting: *Deleted

## 2011-05-08 ENCOUNTER — Ambulatory Visit: Payer: Medicare Other | Admitting: Internal Medicine

## 2011-10-31 ENCOUNTER — Ambulatory Visit: Payer: Medicare Other | Admitting: Internal Medicine

## 2012-05-03 ENCOUNTER — Other Ambulatory Visit: Payer: Self-pay | Admitting: *Deleted

## 2012-05-03 MED ORDER — MECLIZINE HCL 25 MG PO TABS: 12.5000 mg | ORAL_TABLET | Freq: Three times a day (TID) | ORAL | Status: AC | PRN

## 2012-05-03 NOTE — Telephone Encounter (Signed)
Pt's husband notified Rx was sent in for pt and to follow up with Dr. Alphonsus Sias if sxs don't improve

## 2012-05-03 NOTE — Telephone Encounter (Signed)
Please refil times one in Dr Karle Starch absence and have pt f/u if no improvement in sympotms

## 2013-04-10 DIAGNOSIS — J342 Deviated nasal septum: Secondary | ICD-10-CM | POA: Diagnosis not present

## 2013-04-14 DIAGNOSIS — B370 Candidal stomatitis: Secondary | ICD-10-CM | POA: Diagnosis not present

## 2013-05-15 ENCOUNTER — Encounter: Payer: Self-pay | Admitting: Internal Medicine

## 2013-05-15 ENCOUNTER — Ambulatory Visit (INDEPENDENT_AMBULATORY_CARE_PROVIDER_SITE_OTHER): Payer: Medicare Other | Admitting: Internal Medicine

## 2013-05-15 VITALS — BP 140/78 | HR 96 | Temp 98.4°F | Wt 162.0 lb

## 2013-05-15 DIAGNOSIS — K219 Gastro-esophageal reflux disease without esophagitis: Secondary | ICD-10-CM | POA: Diagnosis not present

## 2013-05-15 DIAGNOSIS — J019 Acute sinusitis, unspecified: Secondary | ICD-10-CM | POA: Diagnosis not present

## 2013-05-15 NOTE — Patient Instructions (Signed)
Please take the ranitidine twice a day for at least 2 weeks after all your symptoms are gone (lump in throat, burning and voice changes). Call if the symptoms persist despite the medication.

## 2013-05-15 NOTE — Assessment & Plan Note (Signed)
Seems to be better despite only a few days of antibiotic Probably viral Not clear if she had a reaction to the antibiotic---so will not put it as allergy

## 2013-05-15 NOTE — Progress Notes (Signed)
Pre visit review using our clinic review tool, if applicable. No additional management support is needed unless otherwise documented below in the visit note. 

## 2013-05-15 NOTE — Assessment & Plan Note (Signed)
Clearly acting up with voice changes, burning and lump in throat Will have her use the ranitidine bid for at least 2 weeks---then can go back to prn if symptoms gone

## 2013-05-15 NOTE — Progress Notes (Signed)
   Subjective:    Patient ID: Sherry Phillips, female    DOB: 03-07-1934, 80 y.o.   MRN: [id number]  HPI Having burning in her throat Started "quite some time ago" but worse now Had lump in her throat last night but was gone today--took the zantac last night (but rarely otherwise)  Went to urgent care 4 days ago Diagnosed with "flu" and given Rx for amoxil Mouth broke out bad after 7 doses Then given [facility] mouthwash--still has bad sore spot Did stop the amoxil  Had been sneezing mostly before going to urgent care Clear drainage from nose--for about 2 weeks This seems better  Voice is off also Some burning to sides of nose  Current Outpatient Prescriptions on File Prior to Visit  Medication Sig Dispense Refill  . naproxen sodium (ANAPROX) 220 MG tablet Take one by mouth at bedtime       . ranitidine (ZANTAC) 150 MG tablet Take 150 mg by mouth at bedtime.         No current facility-administered medications on file prior to visit.    No Known Allergies  Past Medical History  Diagnosis Date  . GERD (gastroesophageal reflux disease)   . Hyperlipidemia   . Osteoarthritis     Past Surgical History  Procedure Laterality Date  . Appendectomy  1962    with ruptured ovary repair  . Breast biopsy  1970  . Cataract extraction      both eyes    Family History  Problem Relation Age of Onset  . Hypertension Mother   . Diabetes Sister     borderline  . Coronary artery disease Brother   . Lung disease Brother   . Cancer Neg Hx     no breast or colon cancer    History   Social History  . Marital Status: Married    Spouse Name: N/A    Number of Children: 2  . Years of Education: N/A   Occupational History  . homemaker    Social History Main Topics  . Smoking status: Never Smoker   . Smokeless tobacco: Never Used  . Alcohol Use: No  . Drug Use: Not on file  . Sexual Activity: Not on file   Other Topics Concern  . 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   One son and one daughter, [name]   Has living will.  Requests husband to make decisions if needed- then daughter.  Would accept CPR at this point, but no prolonged artificial ventilation.  Would not want tube feeds.   Review of Systems No fever No cough now No general swallowing problems    Objective:   Physical Exam  Constitutional: She appears well-developed and well-nourished. No distress.  HENT:  Mouth/Throat: Oropharynx is clear and moist. No oropharyngeal exudate.  TMs normal No oral ulcers Mild nasal congestion  Neck: Normal range of motion. Neck supple. No thyromegaly present.  Pulmonary/Chest: Effort normal and breath sounds normal. No respiratory distress. She has no wheezes. She has no rales.  Abdominal: Soft. She exhibits no distension. There is no tenderness.  Lymphadenopathy:    She has no cervical adenopathy.          Assessment & Plan:

## 2014-05-15 DIAGNOSIS — H3531 Nonexudative age-related macular degeneration: Secondary | ICD-10-CM | POA: Diagnosis not present

## 2014-06-08 DIAGNOSIS — H3532 Exudative age-related macular degeneration: Secondary | ICD-10-CM | POA: Diagnosis not present

## 2014-07-22 DIAGNOSIS — H3532 Exudative age-related macular degeneration: Secondary | ICD-10-CM | POA: Diagnosis not present

## 2014-09-02 DIAGNOSIS — H3532 Exudative age-related macular degeneration: Secondary | ICD-10-CM | POA: Diagnosis not present

## 2014-09-30 ENCOUNTER — Encounter: Payer: Self-pay | Admitting: Internal Medicine

## 2014-09-30 ENCOUNTER — Ambulatory Visit (INDEPENDENT_AMBULATORY_CARE_PROVIDER_SITE_OTHER): Payer: Medicare Other | Admitting: Internal Medicine

## 2014-09-30 VITALS — BP 140/70 | HR 81 | Temp 98.4°F | Wt 162.0 lb

## 2014-09-30 DIAGNOSIS — R109 Unspecified abdominal pain: Secondary | ICD-10-CM | POA: Insufficient documentation

## 2014-09-30 DIAGNOSIS — R10A1 Flank pain, right side: Secondary | ICD-10-CM | POA: Insufficient documentation

## 2014-09-30 NOTE — Assessment & Plan Note (Signed)
Clearly seems to be muscular with symptoms with stretching and reaching and not related to eating Better now  reassured

## 2014-09-30 NOTE — Progress Notes (Signed)
   Subjective:    Patient ID: Sherry Phillips, female    DOB: 01/25/1934, 81 y.o.   MRN: [id number]  HPI Here due to some pain in right side Intermittent in past week Daughter felt she should be seen Had similar pain 4 years ago--no diagnosis (ultrasound and then ?CT)  Has sense of "someone's fist under my ribs" and gets swelling sensation Notices after working in the yard Seems better since appt made yesterday AM Has been doing a lot of yard work lately ---helping a neighbor   No symptoms after eating (other than chronic bloating) No nausea or vomiting Bowels are normal No fever Appetite is fine  Tylenol does help it  Husband died last year She has had some grieving but basically doing okay  Current Outpatient Prescriptions on File Prior to Visit  Medication Sig Dispense Refill  . naproxen sodium (ANAPROX) 220 MG tablet Take one by mouth at bedtime     . ranitidine (ZANTAC) 150 MG tablet Take 150 mg by mouth at bedtime.       No current facility-administered medications on file prior to visit.    Allergies  Allergen Reactions  . Albumen, Egg Rash    Past Medical History  Diagnosis Date  . GERD (gastroesophageal reflux disease)   . Hyperlipidemia   . Osteoarthritis     Past Surgical History  Procedure Laterality Date  . Appendectomy  1962    with ruptured ovary repair  . Breast biopsy  1970  . Cataract extraction      both eyes    Family History  Problem Relation Age of Onset  . Hypertension Mother   . Diabetes Sister     borderline  . Coronary artery disease Brother   . Lung disease Brother   . Cancer Neg Hx     no breast or colon cancer    History   Social History  . Marital Status: Married    Spouse Name: N/A  . Number of Children: 2  . Years of Education: N/A   Occupational History  . homemaker    Social History Main Topics  . Smoking status: Never Smoker   . Smokeless tobacco: Never Used  . Alcohol Use: No  . Drug Use: Not on  file  . Sexual Activity: Not on file   Other Topics Concern  . 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   One son and one daughter, [name]   Has living will.  Requests husband to make decisions if needed- then daughter.  Would accept CPR at this point, but no prolonged artificial ventilation.  Would not want tube feeds.   Review of Systems No cough No breathing problems     Objective:   Physical Exam  Constitutional: She appears well-developed and well-nourished. No distress.  Neck: Normal range of motion. Neck supple. No thyromegaly present.  Pulmonary/Chest: Effort normal and breath sounds normal. No respiratory distress. She has no wheezes. She has no rales.  Abdominal: Soft. Bowel sounds are normal. She exhibits no distension. There is no tenderness. There is no rebound and no guarding.  Murphy's sign absent  Lymphadenopathy:    She has no cervical adenopathy.  Psychiatric: She has a normal mood and affect. Her behavior is normal.          Assessment & Plan:

## 2014-09-30 NOTE — Progress Notes (Signed)
Pre visit review using our clinic review tool, if applicable. No additional management support is needed unless otherwise documented below in the visit note. 

## 2014-10-21 DIAGNOSIS — H3532 Exudative age-related macular degeneration: Secondary | ICD-10-CM | POA: Diagnosis not present

## 2014-11-05 ENCOUNTER — Ambulatory Visit (INDEPENDENT_AMBULATORY_CARE_PROVIDER_SITE_OTHER): Payer: Medicare Other | Admitting: Internal Medicine

## 2014-11-05 ENCOUNTER — Encounter: Payer: Self-pay | Admitting: Internal Medicine

## 2014-11-05 VITALS — BP 130/70 | HR 92 | Temp 97.7°F | Wt 163.0 lb

## 2014-11-05 DIAGNOSIS — L255 Unspecified contact dermatitis due to plants, except food: Secondary | ICD-10-CM | POA: Insufficient documentation

## 2014-11-05 MED ORDER — HYDROCORTISONE 2.5 % EX CREA: TOPICAL_CREAM | Freq: Three times a day (TID) | CUTANEOUS | Status: DC | PRN

## 2014-11-05 NOTE — Progress Notes (Signed)
Pre visit review using our clinic review tool, if applicable. No additional management support is needed unless otherwise documented below in the visit note. 

## 2014-11-05 NOTE — Assessment & Plan Note (Signed)
Fairly minimal but itchy She was most concerned it could move into her eye---discussed how only exposure again to the oil could do that She will carefully clean off tools she used Hydrocortisone 2.5 cream carefully Explained she doesn't need systemic steroids for this

## 2014-11-05 NOTE — Progress Notes (Signed)
   Subjective:    Patient ID: Sherry Phillips, female    DOB: 04/14/1933, 81 y.o.   MRN: [id number]  HPI Here due to rash  Worried about "poison oak" around right eye Had rash on face about 2 weeks ago--- got a cream at drug store and this finally cleared Did have exposure shortly before this  Noticed the itchy areas to side of right eye and on eyelid ~3 days ago No clear indication that she could have been reexposed  Didn't use the cream---it stated not to use around her eye  Current Outpatient Prescriptions on File Prior to Visit  Medication Sig Dispense Refill  . naproxen sodium (ANAPROX) 220 MG tablet Take one by mouth at bedtime     . ranitidine (ZANTAC) 150 MG tablet Take 150 mg by mouth at bedtime.       No current facility-administered medications on file prior to visit.    Allergies  Allergen Reactions  . Albumen, Egg Rash    Past Medical History  Diagnosis Date  . GERD (gastroesophageal reflux disease)   . Hyperlipidemia   . Osteoarthritis     Past Surgical History  Procedure Laterality Date  . Appendectomy  1962    with ruptured ovary repair  . Breast biopsy  1970  . Cataract extraction      both eyes    Family History  Problem Relation Age of Onset  . Hypertension Mother   . Diabetes Sister     borderline  . Coronary artery disease Brother   . Lung disease Brother   . Cancer Neg Hx     no breast or colon cancer    Social History   Social History  . Marital Status: Widowed    Spouse Name: N/A  . Number of Children: 2  . Years of Education: N/A   Occupational History  . homemaker    Social History Main Topics  . Smoking status: Never Smoker   . Smokeless tobacco: Never Used  . Alcohol Use: No  . Drug Use: Not on file  . Sexual Activity: Not on file   Other Topics Concern  . 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   One son and one daughter, [name]   Has living will.  Requests  husband to make decisions if needed- then daughter.  Would accept CPR at this point, but no prolonged artificial ventilation.  Would not want tube feeds.   Review of Systems No fever No rash anywhere else--but early itching by filtrum    Objective:   Physical Exam  Constitutional: She appears well-developed and well-nourished. No distress.  Eyes:  Conjunctiva are normal Slight reddish papular area on upper lid and to the side of the lateral canthus          Assessment & Plan:

## 2014-11-23 ENCOUNTER — Ambulatory Visit (INDEPENDENT_AMBULATORY_CARE_PROVIDER_SITE_OTHER): Payer: Medicare Other | Admitting: Internal Medicine

## 2014-11-23 ENCOUNTER — Encounter: Payer: Self-pay | Admitting: Internal Medicine

## 2014-11-23 VITALS — BP 140/80 | HR 91 | Temp 98.6°F | Wt 163.0 lb

## 2014-11-23 DIAGNOSIS — R5383 Other fatigue: Secondary | ICD-10-CM | POA: Insufficient documentation

## 2014-11-23 LAB — CBC WITH DIFFERENTIAL/PLATELET
BASOS PCT: 0.9 % (ref 0.0–3.0)
Basophils Absolute: 0.1 10*3/uL (ref 0.0–0.1)
EOS ABS: 0.4 10*3/uL (ref 0.0–0.7)
Eosinophils Relative: 4.3 % (ref 0.0–5.0)
HEMATOCRIT: 46.2 % — AB (ref 36.0–46.0)
Hemoglobin: 15.4 g/dL — ABNORMAL HIGH (ref 12.0–15.0)
LYMPHS PCT: 24.1 % (ref 12.0–46.0)
Lymphs Abs: 2.3 10*3/uL (ref 0.7–4.0)
MCHC: 33.3 g/dL (ref 30.0–36.0)
MCV: 90.5 fl (ref 78.0–100.0)
MONO ABS: 0.9 10*3/uL (ref 0.1–1.0)
Monocytes Relative: 9.2 % (ref 3.0–12.0)
NEUTROS ABS: 5.9 10*3/uL (ref 1.4–7.7)
Neutrophils Relative %: 61.5 % (ref 43.0–77.0)
PLATELETS: 290 10*3/uL (ref 150.0–400.0)
RBC: 5.11 Mil/uL (ref 3.87–5.11)
RDW: 14.2 % (ref 11.5–15.5)
WBC: 9.6 10*3/uL (ref 4.0–10.5)

## 2014-11-23 LAB — COMPREHENSIVE METABOLIC PANEL
ALT: 17 U/L (ref 0–35)
AST: 21 U/L (ref 0–37)
Albumin: 4.2 g/dL (ref 3.5–5.2)
Alkaline Phosphatase: 92 U/L (ref 39–117)
BUN: 12 mg/dL (ref 6–23)
CHLORIDE: 103 meq/L (ref 96–112)
CO2: 30 meq/L (ref 19–32)
CREATININE: 1.18 mg/dL (ref 0.40–1.20)
Calcium: 9.8 mg/dL (ref 8.4–10.5)
GFR: 46.92 mL/min — ABNORMAL LOW (ref >60.00)
Glucose, Bld: 96 mg/dL (ref 70–99)
Potassium: 4.3 mEq/L (ref 3.5–5.1)
SODIUM: 140 meq/L (ref 135–145)
Total Bilirubin: 0.4 mg/dL (ref 0.2–1.2)
Total Protein: 7.2 g/dL (ref 6.0–8.3)

## 2014-11-23 LAB — T4, FREE: Free T4: 0.8 ng/dL (ref 0.60–1.60)

## 2014-11-23 NOTE — Progress Notes (Signed)
Subjective:    Patient ID: Sherry Phillips, female    DOB: 1933-12-14, 81 y.o.   MRN: [id number]  HPI Here due to weakness  Has had a weak spell all week Usually gets this briefly--and feels better after a day Lasting longer this time though feels better today Will feel sleepy--goes to sleep in evening in chair Still keeps up her house--but not much in past week (also yard work like trimming bushes, etc) May have overdone it once in yard before this spell occurred  Wonders about nutritional deficiency--no meat, chicken Vegan-- except rare crisp bacon (of all things) Did start some B12 a couple of weeks ago. Has taken intermittently since then  Current Outpatient Prescriptions on File Prior to Visit  Medication Sig Dispense Refill  . ranitidine (ZANTAC) 150 MG tablet Take 150 mg by mouth at bedtime.       No current facility-administered medications on file prior to visit.    Allergies  Allergen Reactions  . Albumen, Egg Rash    Past Medical History  Diagnosis Date  . GERD (gastroesophageal reflux disease)   . Hyperlipidemia   . Osteoarthritis     Past Surgical History  Procedure Laterality Date  . Appendectomy  1962    with ruptured ovary repair  . Breast biopsy  1970  . Cataract extraction      both eyes    Family History  Problem Relation Age of Onset  . Hypertension Mother   . Diabetes Sister     borderline  . Coronary artery disease Brother   . Lung disease Brother   . Cancer Neg Hx     no breast or colon cancer    Social History   Social History  . Marital Status: Widowed    Spouse Name: N/A  . Number of Children: 2  . Years of Education: N/A   Occupational History  . homemaker    Social History Main Topics  . Smoking status: Never Smoker   . Smokeless tobacco: Never Used  . Alcohol Use: No  . Drug Use: Not on file  . Sexual Activity: Not on file   Other Topics Concern  . Not on file   Social History Narrative   Has worked  some at her church.   One son and one daughter, [name]   Has living will.  Requests husband to make decisions if needed- then daughter.  Would accept CPR at this point, but no prolonged artificial ventilation.  Would not want tube feeds.   Review of Systems Appetite is fine No weight loss Sleeps okay in general No chest pain No SOB Not dizzy but has lightheaded feeling Tried allergy pill last night--- may have helped her. Had taken nightly till before symptoms started (she is somewhat unclear about this)    Objective:   Physical Exam  Constitutional: She appears well-developed and well-nourished. No distress.  HENT:  Mouth/Throat: Oropharynx is clear and moist. No oropharyngeal exudate.  Neck: Normal range of motion. Neck supple. No thyromegaly present.  Cardiovascular: Normal rate, regular rhythm, normal heart sounds and intact distal pulses.  Exam reveals no gallop.   No murmur heard. Pulmonary/Chest: Effort normal and breath sounds normal. No respiratory distress. She has no wheezes. She has no rales.  Abdominal: Soft. She exhibits no distension and no mass. There is no tenderness. There is no rebound and no guarding.  No HSM  Musculoskeletal: She exhibits no edema or tenderness.  Lymphadenopathy:  Head (right side): No tonsillar, no preauricular, no posterior auricular and no occipital adenopathy present.       Head (left side): No tonsillar, no preauricular, no posterior auricular and no occipital adenopathy present.    She has no cervical adenopathy.    She has no axillary adenopathy.       Right: No inguinal adenopathy present.       Left: No inguinal adenopathy present.  Psychiatric: She has a normal mood and affect.          Assessment & Plan:

## 2014-11-23 NOTE — Progress Notes (Signed)
Pre visit review using our clinic review tool, if applicable. No additional management support is needed unless otherwise documented below in the visit note. 

## 2014-11-23 NOTE — Assessment & Plan Note (Signed)
General weakness and some somnolence Since stopping antihistamine??? Looks okay now and feels better today PE reassuring Will check labs Will reeval if new symptoms

## 2014-11-24 ENCOUNTER — Encounter: Payer: Self-pay | Admitting: *Deleted

## 2014-12-28 DIAGNOSIS — H353222 Exudative age-related macular degeneration, left eye, with inactive choroidal neovascularization: Secondary | ICD-10-CM | POA: Diagnosis not present

## 2015-02-15 DIAGNOSIS — H353222 Exudative age-related macular degeneration, left eye, with inactive choroidal neovascularization: Secondary | ICD-10-CM | POA: Diagnosis not present

## 2015-04-19 DIAGNOSIS — H353221 Exudative age-related macular degeneration, left eye, with active choroidal neovascularization: Secondary | ICD-10-CM | POA: Diagnosis not present

## 2015-04-19 DIAGNOSIS — H353112 Nonexudative age-related macular degeneration, right eye, intermediate dry stage: Secondary | ICD-10-CM | POA: Diagnosis not present

## 2015-05-20 ENCOUNTER — Telehealth: Payer: Self-pay | Admitting: Internal Medicine

## 2015-05-20 NOTE — Telephone Encounter (Signed)
Will evaluate then Doesn't sound like an emergency

## 2015-05-20 NOTE — Telephone Encounter (Signed)
Patient Name: Sherry Phillips  DOB: [birth year]    Initial Comment Caller states she had the flu 2 wks ago, legs have been weak since.    Nurse Assessment  Nurse: Mallie Mussel, RN, Alveta Heimlich Date/Time Eilene Ghazi Time): 05/20/2015 12:08:48 PM  Confirm and document reason for call. If symptomatic, describe symptoms. You must click the next button to save text entered. ---Caller states that she had been having some weak spells before she developed the flu 2 weeks ago. She is still very fatigued. She states that she can still do for herself. Denies any unusual bleeding. She last urinated this morning. Denies fever.  Has the patient traveled out of the country within the last 30 days? ---No  Does the patient have any new or worsening symptoms? ---Yes  Will a triage be completed? ---Yes  Related visit to physician within the last 2 weeks? ---No  Does the PT have any chronic conditions? (i.e. diabetes, asthma, etc.) ---No  Is this a behavioral health or substance abuse call? ---No     Guidelines    Guideline Title Affirmed Question Affirmed Notes  Weakness (Generalized) and Fatigue [1] MODERATE weakness (i.e., interferes with work, school, normal activities) AND [2] cause unknown (Exceptions: weakness with acute minor illness, or weakness from poor fluid intake)    Final Disposition User   See Physician within 4 Hours (or PCP triage) Mallie Mussel, RN, [place] only wants to see Dr. Silvio Pate if at all possible. No appointments available for the rest of today. She wants to schedule for tomorrow. She states that she will be okay to wait until tomorrow. I scheduled an appointment with Dr. Silvio Pate at 9:15am tomorrow morning.   Referrals  REFERRED TO PCP OFFICE   Disagree/Comply: Comply

## 2015-05-20 NOTE — Telephone Encounter (Signed)
Pt has appt 05/21/15 at 9:15 with Dr Silvio Pate.

## 2015-05-21 ENCOUNTER — Ambulatory Visit (INDEPENDENT_AMBULATORY_CARE_PROVIDER_SITE_OTHER): Payer: Medicare Other | Admitting: Internal Medicine

## 2015-05-21 ENCOUNTER — Encounter: Payer: Self-pay | Admitting: Internal Medicine

## 2015-05-21 VITALS — BP 138/70 | HR 65 | Temp 97.5°F | Wt 162.5 lb

## 2015-05-21 DIAGNOSIS — J301 Allergic rhinitis due to pollen: Secondary | ICD-10-CM

## 2015-05-21 DIAGNOSIS — E538 Deficiency of other specified B group vitamins: Secondary | ICD-10-CM | POA: Diagnosis not present

## 2015-05-21 DIAGNOSIS — R5383 Other fatigue: Secondary | ICD-10-CM | POA: Diagnosis not present

## 2015-05-21 LAB — COMPREHENSIVE METABOLIC PANEL
ALT: 14 U/L (ref 0–35)
AST: 18 U/L (ref 0–37)
Albumin: 4.3 g/dL (ref 3.5–5.2)
Alkaline Phosphatase: 88 U/L (ref 39–117)
BUN: 15 mg/dL (ref 6–23)
CALCIUM: 9.6 mg/dL (ref 8.4–10.5)
CHLORIDE: 103 meq/L (ref 96–112)
CO2: 29 meq/L (ref 19–32)
CREATININE: 1.25 mg/dL — AB (ref 0.40–1.20)
GFR: 43.84 mL/min — AB (ref >60.00)
Glucose, Bld: 98 mg/dL (ref 70–99)
Potassium: 4.5 mEq/L (ref 3.5–5.1)
Sodium: 139 mEq/L (ref 135–145)
Total Bilirubin: 0.7 mg/dL (ref 0.2–1.2)
Total Protein: 7.5 g/dL (ref 6.0–8.3)

## 2015-05-21 LAB — CBC WITH DIFFERENTIAL/PLATELET
BASOS PCT: 0.5 % (ref 0.0–3.0)
Basophils Absolute: 0.1 10*3/uL (ref 0.0–0.1)
Eosinophils Absolute: 0.3 10*3/uL (ref 0.0–0.7)
Eosinophils Relative: 2.6 % (ref 0.0–5.0)
HEMATOCRIT: 45.4 % (ref 36.0–46.0)
Hemoglobin: 15.1 g/dL — ABNORMAL HIGH (ref 12.0–15.0)
LYMPHS PCT: 18.5 % (ref 12.0–46.0)
Lymphs Abs: 1.9 10*3/uL (ref 0.7–4.0)
MCHC: 33.4 g/dL (ref 30.0–36.0)
MCV: 90.1 fl (ref 78.0–100.0)
MONOS PCT: 8.9 % (ref 3.0–12.0)
Monocytes Absolute: 0.9 10*3/uL (ref 0.1–1.0)
NEUTROS ABS: 7.2 10*3/uL (ref 1.4–7.7)
Neutrophils Relative %: 69.5 % (ref 43.0–77.0)
PLATELETS: 344 10*3/uL (ref 150.0–400.0)
RBC: 5.03 Mil/uL (ref 3.87–5.11)
RDW: 14.2 % (ref 11.5–15.5)
WBC: 10.4 10*3/uL (ref 4.0–10.5)

## 2015-05-21 LAB — VITAMIN B12: VITAMIN B 12: 252 pg/mL (ref 211–911)

## 2015-05-21 LAB — T4, FREE: Free T4: 0.91 ng/dL (ref 0.60–1.60)

## 2015-05-21 MED ORDER — FLUTICASONE PROPIONATE 50 MCG/ACT NA SUSP: 2.0000 | Freq: Every day | NASAL | Status: DC

## 2015-05-21 NOTE — Progress Notes (Signed)
Pre visit review using our clinic review tool, if applicable. No additional management support is needed unless otherwise documented below in the visit note. 

## 2015-05-21 NOTE — Assessment & Plan Note (Signed)
This is unclear but she is on supplements Given her symptoms--will check level

## 2015-05-21 NOTE — Patient Instructions (Signed)
Please come back in if your symptoms are not improving in the next few weeks.

## 2015-05-21 NOTE — Assessment & Plan Note (Signed)
Recurrent vague weakness No MDD--but may be related to losing husband/lonliness No specific physical issues on exam Will check BW again Generally reassured for now

## 2015-05-21 NOTE — Progress Notes (Signed)
Subjective:    Patient ID: Sherry Phillips, female    DOB: [birth year], 82 y.o.   MRN: [id number]  HPI Here with daughter due to fatigue  Reviewed her last visit Doesn't remember her entire course since then Knows she has weak spells over at least the past couple of months Legs feel weak when she gets up---and feels that all over Generally last a few hours--- yesterday was worse No falls Son Merry Proud gets her groceries--she doesn't drive Independent with all instrumental ADLs  No chest pain No SOB---stable DOE doing the stairs No palpitations--does feel heart when exerting Appetite is good Weight stable  Still gets bouts of sadness--- husband died about 1.5 years ago No persistent issues with this  Current Outpatient Prescriptions on File Prior to Visit  Medication Sig Dispense Refill  . ferrous fumarate (HEMOCYTE - 106 MG FE) 325 (106 FE) MG TABS tablet Take 1 tablet by mouth.    . Multiple Vitamins-Minerals (PRESERVISION AREDS 2) CAPS Take by mouth daily.    . ranitidine (ZANTAC) 150 MG tablet Take 150 mg by mouth at bedtime.      . vitamin B-12 (CYANOCOBALAMIN) 1000 MCG tablet Take 1,000 mcg by mouth daily.     No current facility-administered medications on file prior to visit.    Allergies  Allergen Reactions  . Egg Donia Pounds, Egg] Rash    Past Medical History  Diagnosis Date  . GERD (gastroesophageal reflux disease)   . Hyperlipidemia   . Osteoarthritis     Past Surgical History  Procedure Laterality Date  . Appendectomy  1962    with ruptured ovary repair  . Breast biopsy  1970  . Cataract extraction      both eyes    Family History  Problem Relation Age of Onset  . Hypertension Mother   . Diabetes Sister     borderline  . Coronary artery disease Brother   . Lung disease Brother   . Cancer Neg Hx     no breast or colon cancer    Social History   Social History  . Marital Status: Widowed    Spouse Name: N/A  . Number of Children:  2  . Years of Education: N/A   Occupational History  . homemaker    Social History Main Topics  . Smoking status: Never Smoker   . Smokeless tobacco: Never Used  . Alcohol Use: No  . Drug Use: Not on file  . Sexual Activity: Not on file   Other Topics Concern  . 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   One son and one daughter, Sherry Phillips   Has living will.  Requests daughter to make decisions if needed.    Would accept CPR at this point, but no prolonged artificial ventilation.     Would not want tube feeds.   Review of Systems Ongoing vision problems from macular degeneration Gets "eye migraines"---- with swirling vision and then bad headache. Uses tylenol --not clearly helpful None recently though Bad allergies--using allegra Hears click in right ear Bowels okay. No blood in stool No urinary problems No suspicious skin lesions    Objective:   Physical Exam  Constitutional: She appears well-developed and well-nourished. No distress.  HENT:  Mouth/Throat: Oropharynx is clear and moist. No oropharyngeal exudate.  Neck: Normal range of motion. Neck supple. No thyromegaly present.  Cardiovascular: Normal rate, regular rhythm, normal heart sounds and intact distal pulses.  Exam reveals no gallop.   No murmur heard. Pulmonary/Chest: Effort normal and breath sounds normal. No respiratory distress. She has no wheezes. She has no rales.  Abdominal: Soft. There is no tenderness.  No HSM  Musculoskeletal: She exhibits no edema or tenderness.  Lymphadenopathy:    She has no cervical adenopathy.    She has no axillary adenopathy.       Right: No inguinal adenopathy present.       Left: No inguinal adenopathy present.  Skin: No rash noted.  Psychiatric: She has a normal mood and affect. Her behavior is normal.          Assessment & Plan:

## 2015-05-21 NOTE — Assessment & Plan Note (Signed)
Increased symptoms despite allegra Nasal and facial pressure---will try flonase

## 2015-05-25 ENCOUNTER — Encounter: Payer: Self-pay | Admitting: Internal Medicine

## 2015-05-25 ENCOUNTER — Encounter: Payer: Self-pay | Admitting: *Deleted

## 2015-06-14 DIAGNOSIS — H353221 Exudative age-related macular degeneration, left eye, with active choroidal neovascularization: Secondary | ICD-10-CM | POA: Diagnosis not present

## 2015-08-02 DIAGNOSIS — H353221 Exudative age-related macular degeneration, left eye, with active choroidal neovascularization: Secondary | ICD-10-CM | POA: Diagnosis not present

## 2015-09-13 DIAGNOSIS — H353221 Exudative age-related macular degeneration, left eye, with active choroidal neovascularization: Secondary | ICD-10-CM | POA: Diagnosis not present

## 2015-10-01 ENCOUNTER — Encounter: Payer: Medicare Other | Admitting: Internal Medicine

## 2015-10-14 ENCOUNTER — Ambulatory Visit (INDEPENDENT_AMBULATORY_CARE_PROVIDER_SITE_OTHER): Payer: Medicare Other | Admitting: Internal Medicine

## 2015-10-14 ENCOUNTER — Encounter: Payer: Self-pay | Admitting: Internal Medicine

## 2015-10-14 VITALS — BP 138/80 | HR 80 | Temp 97.8°F | Wt 160.0 lb

## 2015-10-14 DIAGNOSIS — W57XXXA Bitten or stung by nonvenomous insect and other nonvenomous arthropods, initial encounter: Secondary | ICD-10-CM

## 2015-10-14 DIAGNOSIS — T148 Other injury of unspecified body region: Secondary | ICD-10-CM | POA: Diagnosis not present

## 2015-10-14 MED ORDER — TRIAMCINOLONE ACETONIDE 0.1 % EX CREA: 1.0000 "application " | TOPICAL_CREAM | Freq: Two times a day (BID) | CUTANEOUS | Status: DC | PRN

## 2015-10-14 NOTE — Assessment & Plan Note (Signed)
These look like fire ant bites (I know the hard way) Discussed using steroid cream and the time course Discussed avoidance and trying bleach if she gets bit again

## 2015-10-14 NOTE — Progress Notes (Signed)
   Subjective:    Patient ID: Sherry Phillips, female    DOB: April 03, 1933, 82 y.o.   MRN: [id number]  HPI Here due to insect bites  Was cleaning out under a bush 2 days ago (by church) Notice some stinging and she pulled her hand out Covered by some tiny black bugs (didn't think they were ants) Blisters broke up yesterday AM  Not really itchy or painful  Current Outpatient Prescriptions on File Prior to Visit  Medication Sig Dispense Refill  . ferrous fumarate (HEMOCYTE - 106 MG FE) 325 (106 FE) MG TABS tablet Take 1 tablet by mouth.    . fexofenadine (ALLEGRA) 180 MG tablet Take 180 mg by mouth daily.    . fluticasone (FLONASE) 50 MCG/ACT nasal spray Place 2 sprays into both nostrils daily. In each nostril 16 g 12  . Multiple Vitamins-Minerals (PRESERVISION AREDS 2) CAPS Take by mouth daily.    . ranitidine (ZANTAC) 150 MG tablet Take 150 mg by mouth at bedtime.      . vitamin B-12 (CYANOCOBALAMIN) 1000 MCG tablet Take 1,000 mcg by mouth daily.     No current facility-administered medications on file prior to visit.    Allergies  Allergen Reactions  . Egg Donia Pounds, Egg] Rash    Past Medical History  Diagnosis Date  . GERD (gastroesophageal reflux disease)   . Hyperlipidemia   . Osteoarthritis     Past Surgical History  Procedure Laterality Date  . Appendectomy  1962    with ruptured ovary repair  . Breast biopsy  1970  . Cataract extraction      both eyes    Family History  Problem Relation Age of Onset  . Hypertension Mother   . Diabetes Sister     borderline  . Coronary artery disease Brother   . Lung disease Brother   . Cancer Neg Hx     no breast or colon cancer    Social History   Social History  . Marital Status: Widowed    Spouse Name: N/A  . Number of Children: 2  . Years of Education: N/A   Occupational History  . homemaker    Social History Main Topics  . Smoking status: Never Smoker   . Smokeless tobacco: Never Used  .  Alcohol Use: No  . Drug Use: Not on file  . Sexual Activity: Not on file   Other Topics Concern  . 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   One son and one daughter, Sherry Phillips   Has living will.  Requests daughter to make decisions if needed.    Would accept CPR at this point, but no prolonged artificial ventilation.     Would not want tube feeds.   Review of Systems No fever Not sick No N/V    Objective:   Physical Exam  Skin:  Multiple small white blisters on radial right forearm (just at and above wrist) 1 or 2 on left forearm also          Assessment & Plan:

## 2015-10-14 NOTE — Progress Notes (Signed)
Pre visit review using our clinic review tool, if applicable. No additional management support is needed unless otherwise documented below in the visit note. 

## 2015-10-22 ENCOUNTER — Ambulatory Visit (INDEPENDENT_AMBULATORY_CARE_PROVIDER_SITE_OTHER): Payer: Medicare Other

## 2015-10-22 VITALS — BP 140/80 | HR 77 | Temp 98.0°F | Ht 61.0 in | Wt 157.8 lb

## 2015-10-22 DIAGNOSIS — Z0000 Encounter for general adult medical examination without abnormal findings: Secondary | ICD-10-CM

## 2015-10-22 NOTE — Patient Instructions (Signed)
Ms. Cordoba , Thank you for taking time to come for your Medicare Wellness Visit. I appreciate your ongoing commitment to your health goals. Please review the following plan we discussed and let me know if I can assist you in the future.   These are the goals we discussed: Goals    . Increase water intake          Starting 10/22/2015, I will continue drink at least 24oz-72oz of water daily.        This is a list of the screening recommended for you and due dates:  Health Maintenance  Topic Date Due  . DEXA scan (bone density measurement)  10/22/2023*  . Shingles Vaccine  09/29/2024*  . Tetanus Vaccine  09/29/2024*  . Pneumonia vaccines (1 of 2 - PCV13) 09/29/2024*  . Flu Shot  10/26/2015  *Topic was postponed. The date shown is not the original due date.   Preventive Care for Adults  A healthy lifestyle and preventive care can promote health and wellness. Preventive health guidelines for adults include the following key practices.  . A routine yearly physical is a good way to check with your health care provider about your health and preventive screening. It is a chance to share any concerns and updates on your health and to receive a thorough exam.  . Visit your dentist for a routine exam and preventive care every 6 months. Brush your teeth twice a day and floss once a day. Good oral hygiene prevents tooth decay and gum disease.  . The frequency of eye exams is based on your age, health, family medical history, use  of contact lenses, and other factors. Follow your health care provider's ecommendations for frequency of eye exams.  . Eat a healthy diet. Foods like vegetables, fruits, whole grains, low-fat dairy products, and lean protein foods contain the nutrients you need without too many calories. Decrease your intake of foods high in solid fats, added sugars, and salt. Eat the right amount of calories for you. Get information about a proper diet from your health care provider, if  necessary.  . Regular physical exercise is one of the most important things you can do for your health. Most adults should get at least 150 minutes of moderate-intensity exercise (any activity that increases your heart rate and causes you to sweat) each week. In addition, most adults need muscle-strengthening exercises on 2 or more days a week.  Silver Sneakers may be a benefit available to you. To determine eligibility, you may visit the website: www.silversneakers.com or contact program at [phone] Mon-Fri between 8AM-8PM.   . Maintain a healthy weight. The body mass index (BMI) is a screening tool to identify possible weight problems. It provides an estimate of body fat based on height and weight. Your health care provider can find your BMI and can help you achieve or maintain a healthy weight.   For adults 20 years and older: ? A BMI below 18.5 is considered underweight. ? A BMI of 18.5 to 24.9 is normal. ? A BMI of 25 to 29.9 is considered overweight. ? A BMI of 30 and above is considered obese.   . Maintain normal blood lipids and cholesterol levels by exercising and minimizing your intake of saturated fat. Eat a balanced diet with plenty of fruit and vegetables. Blood tests for lipids and cholesterol should begin at age 62 and be repeated every 5 years. If your lipid or cholesterol levels are high, you are over 50, or you  are at high risk for heart disease, you may need your cholesterol levels checked more frequently. Ongoing high lipid and cholesterol levels should be treated with medicines if diet and exercise are not working.  . If you smoke, find out from your health care provider how to quit. If you do not use tobacco, please do not start.  . If you choose to drink alcohol, please do not consume more than 2 drinks per day. One drink is considered to be 12 ounces (355 mL) of beer, 5 ounces (148 mL) of wine, or 1.5 ounces (44 mL) of liquor.  . If you are 66-51 years old, ask your  health care provider if you should take aspirin to prevent strokes.  . Use sunscreen. Apply sunscreen liberally and repeatedly throughout the day. You should seek shade when your shadow is shorter than you. Protect yourself by wearing long sleeves, pants, a wide-brimmed hat, and sunglasses year round, whenever you are outdoors.  . Once a month, do a whole body skin exam, using a mirror to look at the skin on your back. Tell your health care provider of new moles, moles that have irregular borders, moles that are larger than a pencil eraser, or moles that have changed in shape or color.

## 2015-10-22 NOTE — Progress Notes (Signed)
Subjective:   Sherry Phillips is a 82 y.o. female who presents for an Initial Medicare Annual Wellness Visit.  Review of Systems    N/A  Cardiac Risk Factors include: advanced age (>30men, >65 women);sedentary lifestyle;dyslipidemia;obesity (BMI >30kg/m2)     Objective:    Today's Vitals   10/22/15 1433  BP: 140/80  Pulse: 77  Temp: 98 F (36.7 C)  TempSrc: Oral  SpO2: 97%  Weight: 157 lb 12 oz (71.6 kg)  Height: 5\' 1"  (1.549 m)  PainSc: 0-No pain   Body mass index is 29.81 kg/m.   Current Medications (verified) Outpatient Encounter Prescriptions as of 10/22/2015  Medication Sig  . ferrous fumarate (HEMOCYTE - 106 MG FE) 325 (106 FE) MG TABS tablet Take 1 tablet by mouth.  . fexofenadine (ALLEGRA) 180 MG tablet Take 180 mg by mouth daily.  . fluticasone (FLONASE) 50 MCG/ACT nasal spray Place 2 sprays into both nostrils daily. In each nostril  . Multiple Vitamins-Minerals (PRESERVISION AREDS 2) CAPS Take by mouth daily.  . ranitidine (ZANTAC) 150 MG tablet Take 150 mg by mouth at bedtime.    . triamcinolone cream (KENALOG) 0.1 % Apply 1 application topically 2 (two) times daily as needed.  . vitamin B-12 (CYANOCOBALAMIN) 1000 MCG tablet Take 1,000 mcg by mouth daily.   No facility-administered encounter medications on file as of 10/22/2015.     Allergies (verified) Egg white [albumen, egg]   History: Past Medical History:  Diagnosis Date  . GERD (gastroesophageal reflux disease)   . Hyperlipidemia   . Osteoarthritis    Past Surgical History:  Procedure Laterality Date  . APPENDECTOMY  1962   with ruptured ovary repair  . BREAST BIOPSY  1970  . CATARACT EXTRACTION     both eyes   Family History  Problem Relation Age of Onset  . Hypertension Mother   . Diabetes Sister     borderline  . Coronary artery disease Brother   . Lung disease Brother   . Cancer Neg Hx     no breast or colon cancer   Social History   Occupational History  .  homemaker    Social History Main Topics  . Smoking status: Never Smoker  . Smokeless tobacco: Never Used  . Alcohol use No  . Drug use: Unknown  . Sexual activity: No    Tobacco Counseling Counseling given: No   Activities of Daily Living In your present state of health, do you have any difficulty performing the following activities: 10/22/2015  Hearing? N  Vision? N  Difficulty concentrating or making decisions? N  Walking or climbing stairs? N  Dressing or bathing? N  Doing errands, shopping? Y  Preparing Food and eating ? N  Using the Toilet? N  In the past six months, have you accidently leaked urine? N  Do you have problems with loss of bowel control? N  Managing your Medications? N  Managing your Finances? N  Housekeeping or managing your Housekeeping? N  Some recent data might be hidden    Immunizations and Health Maintenance  There is no immunization history on file for this patient. There are no preventive care reminders to display for this patient.  Patient Care Team: [name], MD as PCP - General Estill Cotta, MD as Referring Physician (Ophthalmology) Eulogio Bear, MD as Consulting Physician (Ophthalmology)     Assessment:   This is a routine wellness examination for [facility].   Hearing/Vision screen  Hearing Screening  125Hz  250Hz  500Hz  1000Hz  2000Hz  3000Hz  4000Hz  6000Hz  8000Hz   Right ear:   0 0 0  0    Left ear:   0 0 0  0    Vision Screening Comments: Next vision exam on 10/26/15 with Dr. Edison Pace. Dr. Sandra Cockayne is regular eye MD.   Dietary issues and exercise activities discussed: Current Exercise Habits: The patient does not participate in regular exercise at present, Exercise limited by: None identified  Goals    . Increase water intake          Starting 10/22/2015, I will continue drink at least 24oz-72oz of water daily.       Depression Screen PHQ 2/9 Scores 10/22/2015 09/30/2014  PHQ - 2 Score 0 0    Fall Risk Fall Risk   10/22/2015 09/30/2014  Falls in the past year? Yes No  Number falls in past yr: 1 -  Injury with Fall? No -  Follow up Falls evaluation completed -    Cognitive Function: MMSE - Mini Mental State Exam 10/22/2015  Orientation to time 5  Orientation to Place 5  Registration 3  Attention/ Calculation 0  Recall 3  Language- name 2 objects 0  Language- repeat 1  Language- follow 3 step command 3  Language- read & follow direction 0  Write a sentence 0  Copy design 0  Total score 20    Screening Tests Health Maintenance  Topic Date Due  . DEXA SCAN  10/22/2023 (Originally 07/08/2000)  . ZOSTAVAX  09/29/2024 (Originally 07/09/1995)  . TETANUS/TDAP  09/29/2024 (Originally 07/09/1954)  . PNA vac Low Risk Adult (1 of 2 - PCV13) 09/29/2024 (Originally 07/08/2000)  . INFLUENZA VACCINE  10/26/2015      Plan:     I have personally reviewed and addressed the Medicare Annual Wellness questionnaire and have noted the following in the patient's chart:  A. Medical and social history B. Use of alcohol, tobacco or illicit drugs  C. Current medications and supplements D. Functional ability and status E.  Nutritional status F.  Physical activity G. Advance directives H. List of other physicians I.  Hospitalizations, surgeries, and ER visits in previous 12 months J.  [place] to include hearing, vision, cognitive, depression L. Referrals and appointments - none  In addition, I have reviewed and discussed with patient certain preventive protocols, quality metrics, and best practice recommendations. A written personalized care plan for preventive services as well as general preventive health recommendations were provided to patient.  See attached scanned questionnaire for additional information.   Signed,   Lindell Noe, MHA, BS, LPN Health Advisor

## 2015-10-22 NOTE — Progress Notes (Signed)
Pre visit review using our clinic review tool, if applicable. No additional management support is needed unless otherwise documented below in the visit note. 

## 2015-10-22 NOTE — Progress Notes (Signed)
PCP notes:   Health maintenance:  Bone density - declined  Abnormal screenings:   Hearing - failed Fall risk - hx of fall without injury  Patient concerns:   None  Nurse concerns:  None  Next PCP appt:   None; pt stated she had no needs at this time

## 2015-10-24 NOTE — Progress Notes (Signed)
I reviewed health advisor's note, was available for consultation, and agree with documentation and plan.  

## 2015-10-26 DIAGNOSIS — H353222 Exudative age-related macular degeneration, left eye, with inactive choroidal neovascularization: Secondary | ICD-10-CM | POA: Diagnosis not present

## 2015-10-26 DIAGNOSIS — H353221 Exudative age-related macular degeneration, left eye, with active choroidal neovascularization: Secondary | ICD-10-CM | POA: Diagnosis not present

## 2015-11-30 DIAGNOSIS — H353221 Exudative age-related macular degeneration, left eye, with active choroidal neovascularization: Secondary | ICD-10-CM | POA: Diagnosis not present

## 2016-01-07 DIAGNOSIS — H353221 Exudative age-related macular degeneration, left eye, with active choroidal neovascularization: Secondary | ICD-10-CM | POA: Diagnosis not present

## 2016-02-21 DIAGNOSIS — H353231 Exudative age-related macular degeneration, bilateral, with active choroidal neovascularization: Secondary | ICD-10-CM | POA: Diagnosis not present

## 2016-03-01 DIAGNOSIS — H43813 Vitreous degeneration, bilateral: Secondary | ICD-10-CM | POA: Diagnosis not present

## 2016-03-01 DIAGNOSIS — H353112 Nonexudative age-related macular degeneration, right eye, intermediate dry stage: Secondary | ICD-10-CM | POA: Diagnosis not present

## 2016-04-03 DIAGNOSIS — H353231 Exudative age-related macular degeneration, bilateral, with active choroidal neovascularization: Secondary | ICD-10-CM | POA: Diagnosis not present

## 2016-04-26 ENCOUNTER — Encounter: Payer: Self-pay | Admitting: Internal Medicine

## 2016-04-26 ENCOUNTER — Ambulatory Visit (INDEPENDENT_AMBULATORY_CARE_PROVIDER_SITE_OTHER): Payer: Medicare Other | Admitting: Internal Medicine

## 2016-04-26 VITALS — BP 139/78 | HR 95 | Temp 99.1°F | Resp 18 | Wt 157.0 lb

## 2016-04-26 DIAGNOSIS — B349 Viral infection, unspecified: Secondary | ICD-10-CM

## 2016-04-26 NOTE — Progress Notes (Signed)
   Subjective:    Patient ID: Sherry Phillips, female    DOB: May 01, 1933, 83 y.o.   MRN: [id number]  HPI Here due to respiratory illness  Started 2 nights ago Aching all over Bad headache Appetite is off---nausea just thinking about eating or drinking Lots of chest congestion---better with vick's vaporub Robitussin helping cough  No fever No chills or sweats---but gets cold at night No SOB  Current Outpatient Prescriptions on File Prior to Visit  Medication Sig Dispense Refill  . fexofenadine (ALLEGRA) 180 MG tablet Take 180 mg by mouth daily.    . fluticasone (FLONASE) 50 MCG/ACT nasal spray Place 2 sprays into both nostrils daily. In each nostril 16 g 12  . Multiple Vitamins-Minerals (PRESERVISION AREDS 2) CAPS Take by mouth daily.    . ranitidine (ZANTAC) 150 MG tablet Take 150 mg by mouth at bedtime.      . vitamin B-12 (CYANOCOBALAMIN) 1000 MCG tablet Take 1,000 mcg by mouth daily.     No current facility-administered medications on file prior to visit.     Allergies  Allergen Reactions  . Egg Donia Pounds, Egg] Rash    Past Medical History:  Diagnosis Date  . GERD (gastroesophageal reflux disease)   . Hyperlipidemia   . Osteoarthritis     Past Surgical History:  Procedure Laterality Date  . APPENDECTOMY  1962   with ruptured ovary repair  . BREAST BIOPSY  1970  . CATARACT EXTRACTION     both eyes    Family History  Problem Relation Age of Onset  . Hypertension Mother   . Diabetes Sister     borderline  . Coronary artery disease Brother   . Lung disease Brother   . Cancer Neg Hx     no breast or colon cancer    Social History   Social History  . Marital status: Widowed    Spouse name: N/A  . Number of children: 2  . Years of education: N/A   Occupational History  . homemaker    Social History Main Topics  . Smoking status: Never Smoker  . Smokeless tobacco: Never Used  . Alcohol use No  . Drug use: Unknown  . Sexual activity:  No   Other Topics Concern  . 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   One son and one daughter, [name]   Has living will.  Requests daughter to make decisions if needed.    Would accept CPR at this point, but no prolonged artificial ventilation.     Would not want tube feeds.   Review of Systems Son also sick No diarrhea Trying to just take some sips of water     Objective:   Physical Exam  Constitutional: She appears well-nourished. No distress.  HENT:  Mouth/Throat: Oropharynx is clear and moist. No oropharyngeal exudate.  No sinus tenderness Mild nasal inflammation TMs normal  Neck: Neck supple. No thyromegaly present.  Pulmonary/Chest: Effort normal and breath sounds normal. No respiratory distress. She has no wheezes. She has no rales.  Abdominal: Soft. Bowel sounds are normal. She exhibits no distension. There is no tenderness. There is no rebound and no guarding.  Lymphadenopathy:    She has no cervical adenopathy.          Assessment & Plan:

## 2016-04-26 NOTE — Assessment & Plan Note (Signed)
Not clearly flu like---worst symptom is anorexia and aches Could be the norovirus--just milder Doesn't seem to be worth trying tamiflu Discussed acetaminophen and fluids

## 2016-04-26 NOTE — Progress Notes (Signed)
Pre visit review using our clinic review tool, if applicable. No additional management support is needed unless otherwise documented below in the visit note. 

## 2016-04-26 NOTE — Patient Instructions (Signed)
Please take acetaminophen regularly--- 500 or 650mg  every 4 hours or so. You need to drink regularly---even just 1-2 ounces at a time (but every 10-15 minutes if you can)

## 2016-05-03 ENCOUNTER — Telehealth: Payer: Self-pay

## 2016-05-03 NOTE — Telephone Encounter (Signed)
Spoke to Sonja. 

## 2016-05-03 NOTE — Telephone Encounter (Signed)
Called patient Somewhat better now---coughing improved since this AM Mouth and lips are very dry Drinking a lot of water  Appetite starting to come back Still washed out No SOB No fever Head congestion is better  Let Sonja know I spoke to her She feels a little better Have her increase fluids and keep eating what she can Consider visit tomorrow if not improving

## 2016-05-03 NOTE — Telephone Encounter (Signed)
Sherry Phillips (DPR signed)left v/m; pt seen 04/26/16; pt has been sick since 04/24/16; when pt was seen no meds given. Pt is feeling slightly better with congestion but overall pt condition is not any better. Everything pt eats taste salty; pt is drinking but her mouth is extremely dry even thou pt is drinking liquids; pt is very weak, no energy and feels like lump in throat but thinks that is nerves. Sherry Phillips said pts nerves are shot. Sherry Phillips request [email or web address].

## 2016-05-08 DIAGNOSIS — H353231 Exudative age-related macular degeneration, bilateral, with active choroidal neovascularization: Secondary | ICD-10-CM | POA: Diagnosis not present

## 2016-06-19 DIAGNOSIS — H353231 Exudative age-related macular degeneration, bilateral, with active choroidal neovascularization: Secondary | ICD-10-CM | POA: Diagnosis not present

## 2016-08-02 DIAGNOSIS — H353231 Exudative age-related macular degeneration, bilateral, with active choroidal neovascularization: Secondary | ICD-10-CM | POA: Diagnosis not present

## 2016-08-14 DIAGNOSIS — G43109 Migraine with aura, not intractable, without status migrainosus: Secondary | ICD-10-CM | POA: Diagnosis not present

## 2016-08-14 DIAGNOSIS — H353231 Exudative age-related macular degeneration, bilateral, with active choroidal neovascularization: Secondary | ICD-10-CM | POA: Diagnosis not present

## 2016-09-07 ENCOUNTER — Ambulatory Visit (INDEPENDENT_AMBULATORY_CARE_PROVIDER_SITE_OTHER): Payer: Medicare Other | Admitting: Internal Medicine

## 2016-09-07 ENCOUNTER — Encounter: Payer: Self-pay | Admitting: Internal Medicine

## 2016-09-07 VITALS — BP 132/82 | HR 82 | Temp 98.1°F | Resp 16 | Wt 154.0 lb

## 2016-09-07 DIAGNOSIS — J209 Acute bronchitis, unspecified: Secondary | ICD-10-CM | POA: Diagnosis not present

## 2016-09-07 MED ORDER — AZITHROMYCIN 250 MG PO TABS: ORAL_TABLET | ORAL | 0 refills | Status: DC

## 2016-09-07 NOTE — Progress Notes (Signed)
   Subjective:    Patient ID: Sherry Phillips, female    DOB: Jan 18, 1934, 83 y.o.   MRN: [id number]  HPI Here due to respiratory illness  Having some pain in chest--under sternum--with big breath Can feel something in her throat--with breathing Chest is congested Dry cough Some SOB--feels weak when walking around outside No fever No night chills/sweats Started 2 weeks ago  No sig head congestion or drainage No sore throat or ear pain  Tried tylenol--helped headache  Current Outpatient Prescriptions on File Prior to Visit  Medication Sig Dispense Refill  . fexofenadine (ALLEGRA) 180 MG tablet Take 180 mg by mouth daily.    . fluticasone (FLONASE) 50 MCG/ACT nasal spray Place 2 sprays into both nostrils daily. In each nostril 16 g 12  . Multiple Vitamins-Minerals (PRESERVISION AREDS 2) CAPS Take by mouth daily.    . ranitidine (ZANTAC) 150 MG tablet Take 150 mg by mouth at bedtime.      . vitamin B-12 (CYANOCOBALAMIN) 1000 MCG tablet Take 1,000 mcg by mouth daily.     No current facility-administered medications on file prior to visit.     Allergies  Allergen Reactions  . Egg Donia Pounds, Egg] Rash    Past Medical History:  Diagnosis Date  . GERD (gastroesophageal reflux disease)   . Hyperlipidemia   . Osteoarthritis     Past Surgical History:  Procedure Laterality Date  . APPENDECTOMY  1962   with ruptured ovary repair  . BREAST BIOPSY  1970  . CATARACT EXTRACTION     both eyes    Family History  Problem Relation Age of Onset  . Hypertension Mother   . Diabetes Sister        borderline  . Coronary artery disease Brother   . Lung disease Brother   . Cancer Neg Hx        no breast or colon cancer    Social History   Social History  . Marital status: Widowed    Spouse name: N/A  . Number of children: 2  . Years of education: N/A   Occupational History  . homemaker    Social History Main Topics  . Smoking status: Never Smoker  .  Smokeless tobacco: Never Used  . Alcohol use No  . Drug use: Unknown  . Sexual activity: No   Other Topics Concern  . 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   One son and one daughter, [name]   Has living will.  Requests daughter to make decisions if needed.    Would accept CPR at this point, but no prolonged artificial ventilation.     Would not want tube feeds.   Review of Systems  Nervous from this No vomiting or diarrhea Appetite is off     Objective:   Physical Exam  Constitutional: She appears well-nourished. No distress.  HENT:  Mouth/Throat: Oropharynx is clear and moist. No oropharyngeal exudate.  No sinus tenderness TMs normal No sig nasal inflammation  Neck: No thyromegaly present.  Pulmonary/Chest: Effort normal and breath sounds normal. No respiratory distress. She has no wheezes. She has no rales.  Lymphadenopathy:    She has no cervical adenopathy.          Assessment & Plan:

## 2016-09-07 NOTE — Assessment & Plan Note (Signed)
Sick for 2 weeks and persistent symptoms Likely viral at this point--or atypical Will Rx with z-pak Recheck with CXR if not improving next week

## 2016-09-07 NOTE — Patient Instructions (Signed)
Please let me know on Monday if you aren't feeling better.

## 2016-09-18 DIAGNOSIS — H353231 Exudative age-related macular degeneration, bilateral, with active choroidal neovascularization: Secondary | ICD-10-CM | POA: Diagnosis not present

## 2016-09-26 ENCOUNTER — Telehealth: Payer: Self-pay | Admitting: Internal Medicine

## 2016-09-26 NOTE — Telephone Encounter (Signed)
Okay---will evaluate then 

## 2016-09-26 NOTE — Telephone Encounter (Signed)
Pt has appt with Dr Silvio Pate on 09/28/16 at 4:30.

## 2016-09-26 NOTE — Telephone Encounter (Signed)
Patient Name: Sherry Phillips DOB: 1934/02/21 Initial Comment Caller states her BP is 167/76. Pulse is 77. She's having palpations in her chest. She feels weak. Nurse Assessment Nurse: Rock Nephew, RN, Juliann Pulse Date/Time (Eastern Time): 09/26/2016 8:16:22 AM Confirm and document reason for call. If symptomatic, describe symptoms. ---Caller states her BP is 167/76. Pulse is 77. She is concerned about this and is feeling a little anxious about it since she has not had high blood pressure before. She states she has been eating a lot of tomatoes with salt on them . I clarified with her that she feels weak at times but has felt that way for a long time and thinks it is related to the heat. I also clarified with her that she does not feel heart palpitations or an irregular heart beat but just a sensation in her chest that she cannot describe but admits it is more of an anxious feeling. She denies SOB , chest pain or heart palpitations at present . Does the patient have any new or worsening symptoms? ---Yes Will a triage be completed? ---Yes Related visit to physician within the last 2 weeks? ---Yes Does the PT have any chronic conditions? (i.e. diabetes, asthma, etc.) ---No Is this a behavioral health or substance abuse call? ---No Guidelines Guideline Title Affirmed Question Affirmed Notes High Blood Pressure Systolic BP >= 961 OR Diastolic >= 164 Final Disposition User See PCP When Office is Open (within 3 days) Rock Nephew, RN, Juliann Pulse Comments Appt scheduled with Dr. Silvio Pate for Thursday 09/28/16 at 430 pm per Freida Busman. RN and patient verbalized understanding. Referrals REFERRED TO PCP OFFICE Disagree/Comply: Comply

## 2016-09-28 ENCOUNTER — Encounter: Payer: Self-pay | Admitting: Internal Medicine

## 2016-09-28 ENCOUNTER — Ambulatory Visit (INDEPENDENT_AMBULATORY_CARE_PROVIDER_SITE_OTHER): Payer: Medicare Other | Admitting: Internal Medicine

## 2016-09-28 VITALS — BP 138/74 | HR 84 | Temp 98.0°F | Wt 156.0 lb

## 2016-09-28 DIAGNOSIS — R5383 Other fatigue: Secondary | ICD-10-CM

## 2016-09-28 DIAGNOSIS — R030 Elevated blood-pressure reading, without diagnosis of hypertension: Secondary | ICD-10-CM | POA: Diagnosis not present

## 2016-09-28 NOTE — Assessment & Plan Note (Signed)
BP Readings from Last 3 Encounters:  09/28/16 138/74  09/07/16 132/82  04/26/16 139/78   Repeat on right 138/76 Discussed normal findings here May want to correlate her cuff Will check some labs just in case

## 2016-09-28 NOTE — Assessment & Plan Note (Signed)
Vague symptoms still Doesn't sound pathologic

## 2016-09-28 NOTE — Progress Notes (Signed)
   Subjective:    Patient ID: Sherry Phillips, female    DOB: 05-Oct-1933, 83 y.o.   MRN: [id number]  HPI Here due to concerns about her blood pressure  Monitors her BP intermittently 3 days ago, her BP was 176/78 Other checks 134/68 to 637/85 Most have systolic of 885 or below Some hoarseness No headache  No chest pain No SOB  Current Outpatient Prescriptions on File Prior to Visit  Medication Sig Dispense Refill  . fexofenadine (ALLEGRA) 180 MG tablet Take 180 mg by mouth daily.    . fluticasone (FLONASE) 50 MCG/ACT nasal spray Place 2 sprays into both nostrils daily. In each nostril 16 g 12  . Multiple Vitamins-Minerals (PRESERVISION AREDS 2) CAPS Take by mouth daily.    . ranitidine (ZANTAC) 150 MG tablet Take 150 mg by mouth at bedtime.      . vitamin B-12 (CYANOCOBALAMIN) 1000 MCG tablet Take 1,000 mcg by mouth daily.     No current facility-administered medications on file prior to visit.     Allergies  Allergen Reactions  . Egg Donia Pounds, Egg] Rash    Past Medical History:  Diagnosis Date  . GERD (gastroesophageal reflux disease)   . Hyperlipidemia   . Osteoarthritis     Past Surgical History:  Procedure Laterality Date  . APPENDECTOMY  1962   with ruptured ovary repair  . BREAST BIOPSY  1970  . CATARACT EXTRACTION     both eyes    Family History  Problem Relation Age of Onset  . Hypertension Mother   . Diabetes Sister        borderline  . Coronary artery disease Brother   . Lung disease Brother   . Cancer Neg Hx        no breast or colon cancer    Social History   Social History  . Marital status: Widowed    Spouse name: N/A  . Number of children: 2  . Years of education: N/A   Occupational History  . homemaker    Social History Main Topics  . Smoking status: Never Smoker  . Smokeless tobacco: Never Used  . Alcohol use No  . Drug use: Unknown  . Sexual activity: No   Other Topics Concern  . 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   One son and one daughter, Sherry Phillips   Has living will.  Requests daughter to make decisions if needed.    Would accept CPR at this point, but no prolonged artificial ventilation.     Would not want tube feeds.   Review of Systems She seems to be better from the infection--but still has some intermittent chest "jitteriness" Not coughing    Objective:   Physical Exam  Constitutional: She appears well-nourished. No distress.  Neck: No thyromegaly present.  Cardiovascular: Normal rate, regular rhythm and normal heart sounds.  Exam reveals no gallop.   No murmur heard. Pulmonary/Chest: Effort normal and breath sounds normal. No respiratory distress. She has no wheezes. She has no rales.  Musculoskeletal: She exhibits no edema.  Lymphadenopathy:    She has no cervical adenopathy.          Assessment & Plan:

## 2016-09-29 LAB — CBC WITH DIFFERENTIAL/PLATELET
BASOS ABS: 0.2 10*3/uL — AB (ref 0.0–0.1)
Basophils Relative: 1.8 % (ref 0.0–3.0)
Eosinophils Absolute: 0.5 10*3/uL (ref 0.0–0.7)
Eosinophils Relative: 5.3 % — ABNORMAL HIGH (ref 0.0–5.0)
HCT: 43.4 % (ref 36.0–46.0)
Hemoglobin: 14.4 g/dL (ref 12.0–15.0)
LYMPHS ABS: 2.6 10*3/uL (ref 0.7–4.0)
Lymphocytes Relative: 25.1 % (ref 12.0–46.0)
MCHC: 33.2 g/dL (ref 30.0–36.0)
MCV: 88.2 fl (ref 78.0–100.0)
MONOS PCT: 12 % (ref 3.0–12.0)
Monocytes Absolute: 1.2 10*3/uL — ABNORMAL HIGH (ref 0.1–1.0)
NEUTROS ABS: 5.7 10*3/uL (ref 1.4–7.7)
NEUTROS PCT: 55.8 % (ref 43.0–77.0)
PLATELETS: 346 10*3/uL (ref 150.0–400.0)
RBC: 4.92 Mil/uL (ref 3.87–5.11)
RDW: 14.3 % (ref 11.5–15.5)
WBC: 10.2 10*3/uL (ref 4.0–10.5)

## 2016-09-29 LAB — COMPREHENSIVE METABOLIC PANEL
ALT: 11 U/L (ref 0–35)
AST: 16 U/L (ref 0–37)
Albumin: 4 g/dL (ref 3.5–5.2)
Alkaline Phosphatase: 93 U/L (ref 39–117)
BILIRUBIN TOTAL: 0.4 mg/dL (ref 0.2–1.2)
BUN: 16 mg/dL (ref 6–23)
CO2: 30 meq/L (ref 19–32)
Calcium: 9.6 mg/dL (ref 8.4–10.5)
Chloride: 103 mEq/L (ref 96–112)
Creatinine, Ser: 1.29 mg/dL — ABNORMAL HIGH (ref 0.40–1.20)
GFR: 42.13 mL/min — AB (ref >60.00)
GLUCOSE: 93 mg/dL (ref 70–99)
Potassium: 4.6 mEq/L (ref 3.5–5.1)
SODIUM: 140 meq/L (ref 135–145)
Total Protein: 7 g/dL (ref 6.0–8.3)

## 2016-09-29 LAB — T4, FREE: Free T4: 0.76 ng/dL (ref 0.60–1.60)

## 2016-10-01 ENCOUNTER — Encounter: Payer: Self-pay | Admitting: Internal Medicine

## 2016-10-18 DIAGNOSIS — H353231 Exudative age-related macular degeneration, bilateral, with active choroidal neovascularization: Secondary | ICD-10-CM | POA: Diagnosis not present

## 2016-11-08 DIAGNOSIS — H353221 Exudative age-related macular degeneration, left eye, with active choroidal neovascularization: Secondary | ICD-10-CM | POA: Diagnosis not present

## 2016-12-13 DIAGNOSIS — H353231 Exudative age-related macular degeneration, bilateral, with active choroidal neovascularization: Secondary | ICD-10-CM | POA: Diagnosis not present

## 2016-12-20 DIAGNOSIS — H353231 Exudative age-related macular degeneration, bilateral, with active choroidal neovascularization: Secondary | ICD-10-CM | POA: Diagnosis not present

## 2016-12-28 ENCOUNTER — Observation Stay
Admission: EM | Admit: 2016-12-28 | Discharge: 2016-12-29 | Disposition: A | Discharge status: Home / Self Care | Payer: Medicare Other | Attending: Internal Medicine | Admitting: Internal Medicine

## 2016-12-28 ENCOUNTER — Other Ambulatory Visit: Payer: Self-pay

## 2016-12-28 ENCOUNTER — Emergency Department: Payer: Medicare Other

## 2016-12-28 ENCOUNTER — Encounter: Payer: Self-pay | Admitting: Emergency Medicine

## 2016-12-28 DIAGNOSIS — R0789 Other chest pain: Principal | ICD-10-CM | POA: Insufficient documentation

## 2016-12-28 DIAGNOSIS — H3530 Unspecified macular degeneration: Secondary | ICD-10-CM | POA: Diagnosis not present

## 2016-12-28 DIAGNOSIS — I200 Unstable angina: Secondary | ICD-10-CM | POA: Diagnosis not present

## 2016-12-28 DIAGNOSIS — K219 Gastro-esophageal reflux disease without esophagitis: Secondary | ICD-10-CM | POA: Insufficient documentation

## 2016-12-28 DIAGNOSIS — N183 Chronic kidney disease, stage 3 (moderate): Secondary | ICD-10-CM | POA: Insufficient documentation

## 2016-12-28 DIAGNOSIS — K920 Hematemesis: Secondary | ICD-10-CM | POA: Diagnosis not present

## 2016-12-28 DIAGNOSIS — I517 Cardiomegaly: Secondary | ICD-10-CM | POA: Diagnosis not present

## 2016-12-28 DIAGNOSIS — R109 Unspecified abdominal pain: Secondary | ICD-10-CM | POA: Diagnosis not present

## 2016-12-28 DIAGNOSIS — E538 Deficiency of other specified B group vitamins: Secondary | ICD-10-CM | POA: Insufficient documentation

## 2016-12-28 DIAGNOSIS — K449 Diaphragmatic hernia without obstruction or gangrene: Secondary | ICD-10-CM | POA: Diagnosis not present

## 2016-12-28 DIAGNOSIS — I700 Atherosclerosis of aorta: Secondary | ICD-10-CM | POA: Diagnosis not present

## 2016-12-28 DIAGNOSIS — Z79899 Other long term (current) drug therapy: Secondary | ICD-10-CM | POA: Diagnosis not present

## 2016-12-28 DIAGNOSIS — E785 Hyperlipidemia, unspecified: Secondary | ICD-10-CM | POA: Insufficient documentation

## 2016-12-28 DIAGNOSIS — M1990 Unspecified osteoarthritis, unspecified site: Secondary | ICD-10-CM | POA: Diagnosis not present

## 2016-12-28 DIAGNOSIS — R079 Chest pain, unspecified: Secondary | ICD-10-CM | POA: Diagnosis not present

## 2016-12-28 HISTORY — DX: Diaphragmatic hernia without obstruction or gangrene: K44.9

## 2016-12-28 LAB — BASIC METABOLIC PANEL
ANION GAP: 12 (ref 5–15)
BUN: 15 mg/dL (ref 6–20)
CO2: 24 mmol/L (ref 22–32)
Calcium: 9.5 mg/dL (ref 8.9–10.3)
Chloride: 105 mmol/L (ref 101–111)
Creatinine, Ser: 1.22 mg/dL — ABNORMAL HIGH (ref 0.44–1.00)
GFR, EST AFRICAN AMERICAN: 47 mL/min — AB (ref >60)
GFR, EST NON AFRICAN AMERICAN: 40 mL/min — AB (ref >60)
Glucose, Bld: 138 mg/dL — ABNORMAL HIGH (ref 65–99)
POTASSIUM: 4 mmol/L (ref 3.5–5.1)
SODIUM: 141 mmol/L (ref 135–145)

## 2016-12-28 LAB — HEPATIC FUNCTION PANEL
ALBUMIN: 4 g/dL (ref 3.5–5.0)
ALK PHOS: 90 U/L (ref 38–126)
ALT: 15 U/L (ref 14–54)
AST: 21 U/L (ref 15–41)
Bilirubin, Direct: <0.1 mg/dL — ABNORMAL LOW (ref 0.1–0.5)
TOTAL PROTEIN: 7.2 g/dL (ref 6.5–8.1)
Total Bilirubin: 0.8 mg/dL (ref 0.3–1.2)

## 2016-12-28 LAB — CBC
HEMATOCRIT: 43.8 % (ref 35.0–47.0)
HEMOGLOBIN: 14.6 g/dL (ref 12.0–16.0)
MCH: 28.5 pg (ref 26.0–34.0)
MCHC: 33.5 g/dL (ref 32.0–36.0)
MCV: 85 fL (ref 80.0–100.0)
Platelets: 302 10*3/uL (ref 150–440)
RBC: 5.15 MIL/uL (ref 3.80–5.20)
RDW: 15.1 % — ABNORMAL HIGH (ref 11.5–14.5)
WBC: 9.8 10*3/uL (ref 3.6–11.0)

## 2016-12-28 LAB — PROTIME-INR
INR: 0.94
Prothrombin Time: 12.5 seconds (ref 11.4–15.2)

## 2016-12-28 LAB — TROPONIN I: Troponin I: <0.03 ng/mL (ref <0.03)

## 2016-12-28 LAB — APTT: APTT: 29 s (ref 24–36)

## 2016-12-28 IMAGING — CT CT ANGIO CHEST-ABD-PELV FOR DISSECTION W/ AND WO/W CM
2 of 7 series · 14 of 46 positions shown, 16 images · IV contrast (APPLIED)
Comparison: CT scan of [DATE].

CLINICAL DATA: Chest and abdominal pain.

EXAM:
CT ANGIOGRAPHY CHEST, ABDOMEN AND PELVIS
TECHNIQUE: Multidetector CT imaging through the chest, abdomen and pelvis was
performed using the standard protocol during bolus administration of
intravenous contrast. Multiplanar reconstructed images and MIPs were
obtained and reviewed to evaluate the vascular anatomy.
CONTRAST:  75 mL of Isovue 370 intravenously.

[Series 5: axial arterial · axial · arterial · 0.67mm/px · z∈[-1018,-514]mm · 11 of 194 slices shown, 13 images]
[im 13/194  soft-tissue]
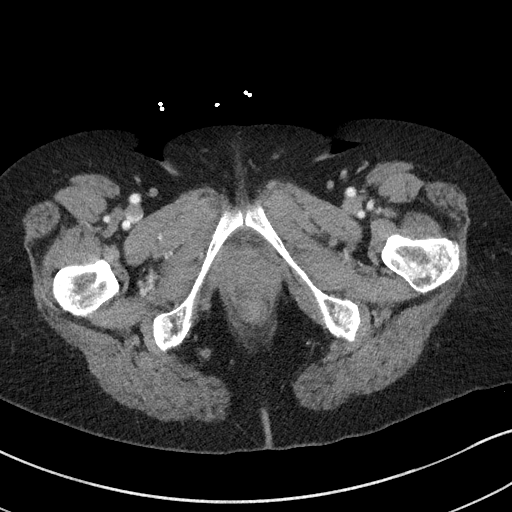
[im 13/194  bone]
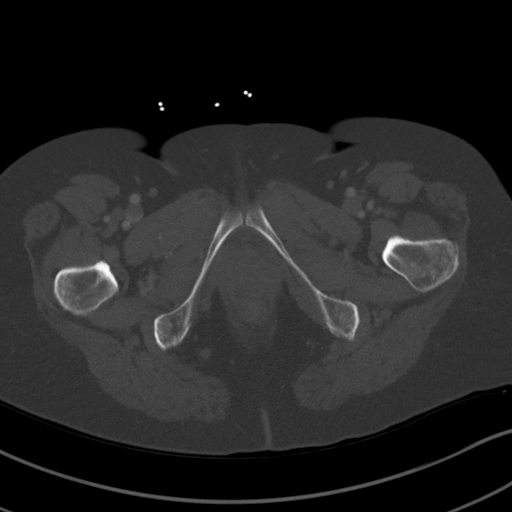
[im 37/194  soft-tissue]
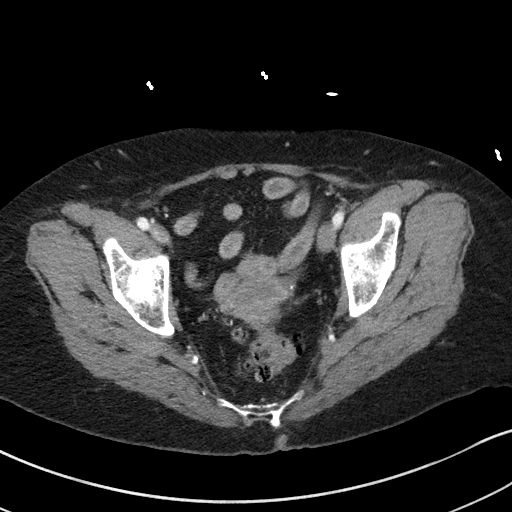
[im 49/194  soft-tissue]
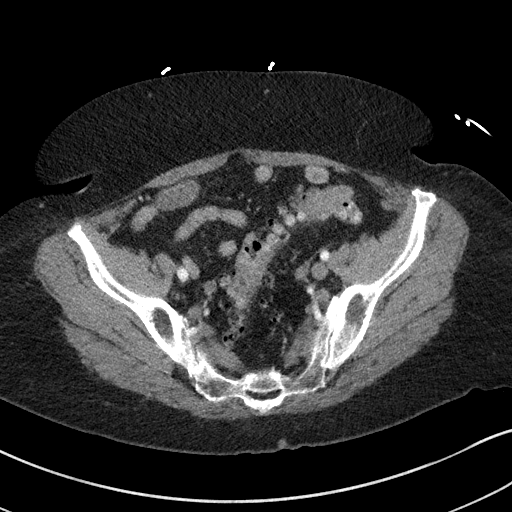
[im 61/194  soft-tissue]
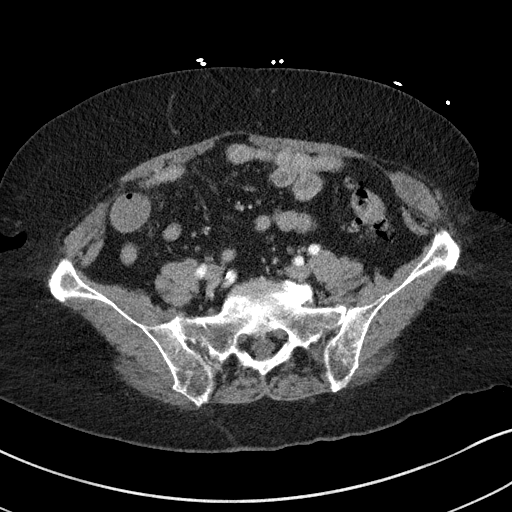
[im 85/194  soft-tissue]
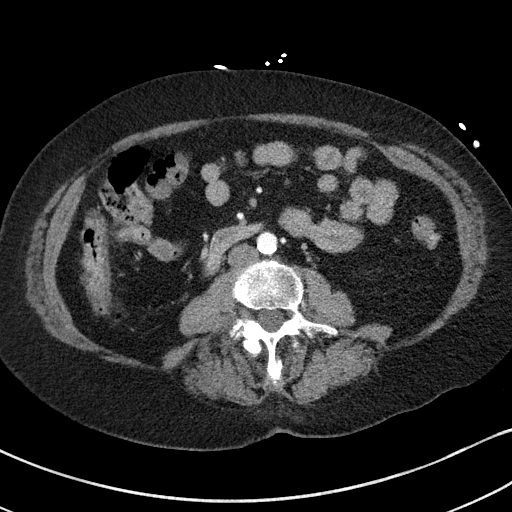
[im 97/194  soft-tissue]
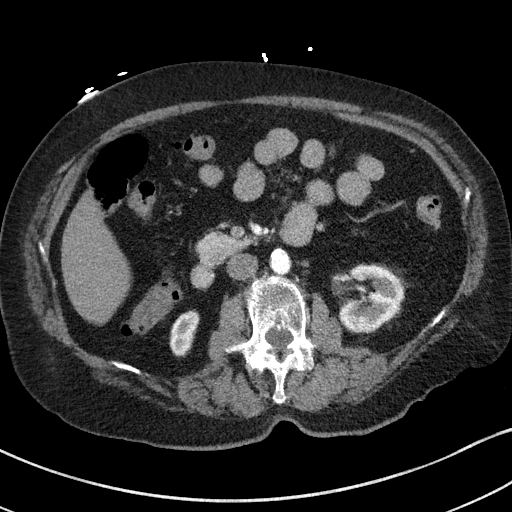
[im 109/194  soft-tissue]
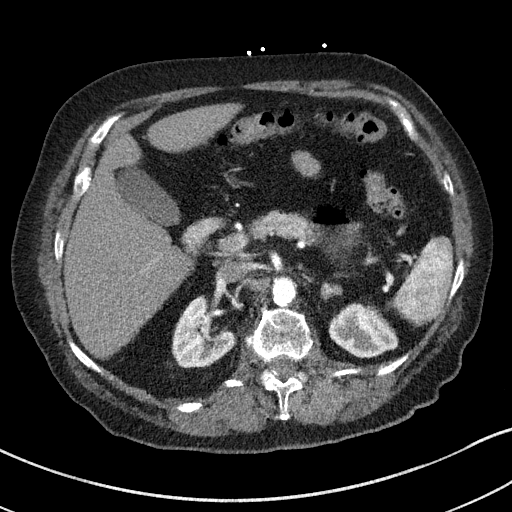
[im 133/194  soft-tissue]
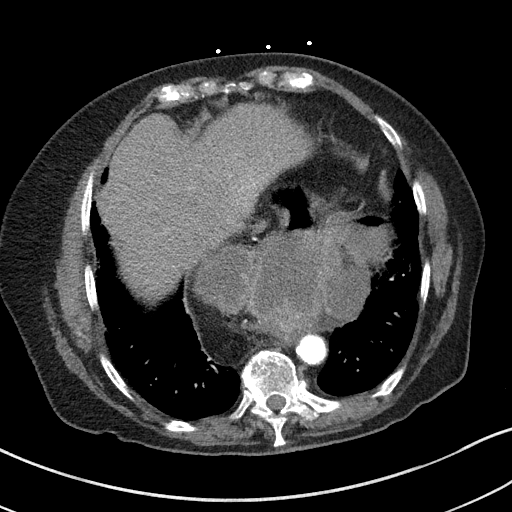
[im 145/194  soft-tissue]
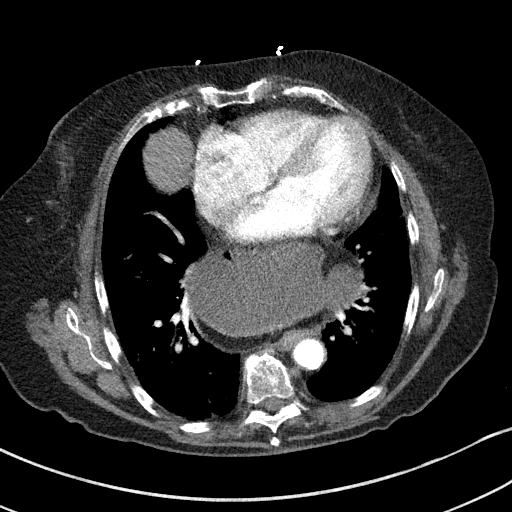
[im 145/194  bone]
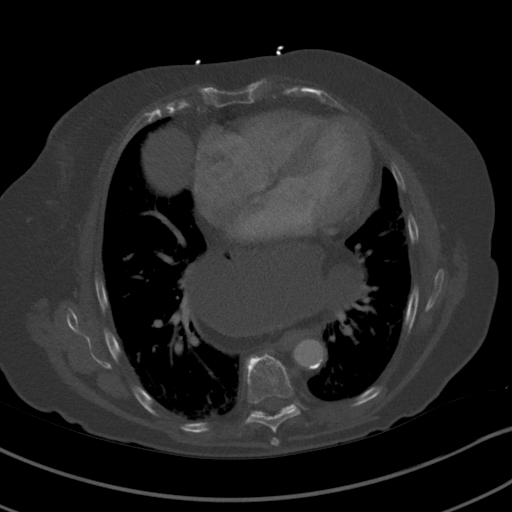
[im 157/194  soft-tissue]
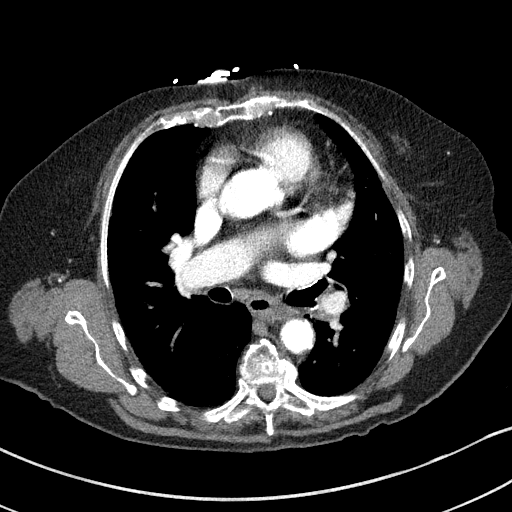
[im 181/194  soft-tissue]
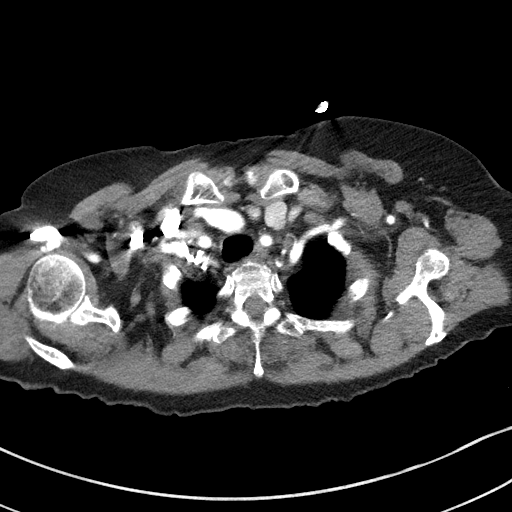

[Series 7: coronals · coronal · 0.70mm/px · 3 of 128 slices shown]
[im 32/128  soft-tissue]
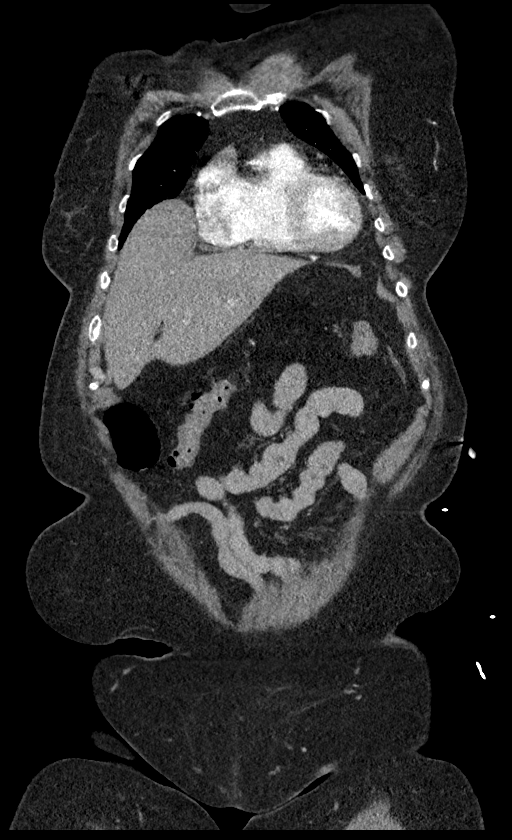
[im 64/128  soft-tissue]
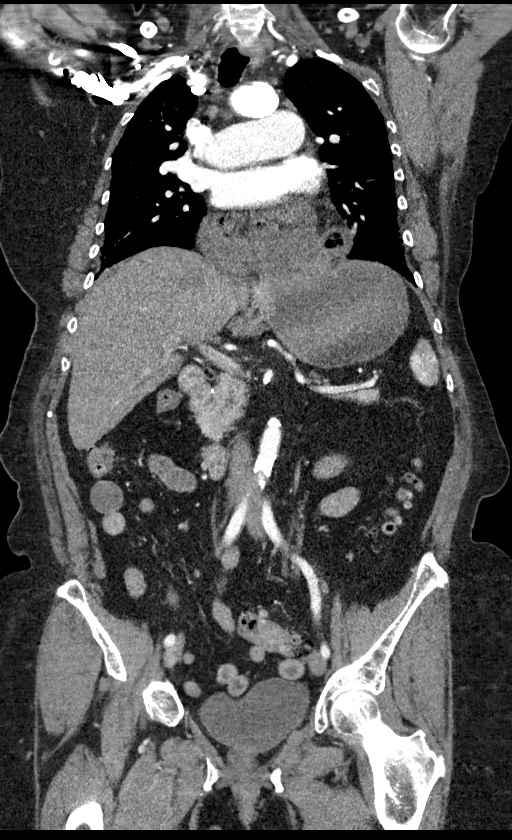
[im 96/128  soft-tissue]
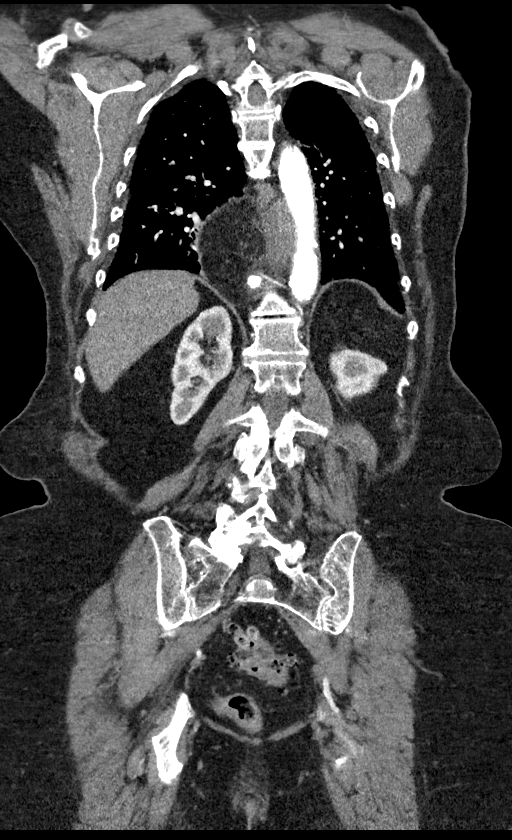

[14 of 46 positions shown; findings below may reference images not displayed]

FINDINGS: CTA CHEST FINDINGS

Cardiovascular: Atherosclerosis of thoracic aorta is noted without
aneurysm or dissection. Great vessels are widely patent without
significant stenosis. Mild cardiomegaly is noted. No pericardial
effusion is noted. Pulmonary arteries are enlarged suggesting
pulmonary artery hypertension.

Mediastinum/Nodes: Large paraesophageal hiatal hernia is noted.
Thyroid gland is unremarkable. No adenopathy is noted.

Lungs/Pleura: Lungs are clear. No pleural effusion or pneumothorax.

Musculoskeletal: No chest wall abnormality. No acute or significant
osseous findings.

Review of the MIP images confirms the above findings.

CTA ABDOMEN AND PELVIS FINDINGS

VASCULAR

Aorta: Atherosclerosis of abdominal aorta is noted without aneurysm
or dissection.

Celiac: Moderate focal stenosis is noted at origin secondary to
calcified plaque.

SMA: Patent without evidence of aneurysm, dissection, vasculitis or
significant stenosis.

Renals: Heavily calcified plaque is noted at the origins of both
renal arteries resulting in severe stenosis.

IMA: Patent without evidence of aneurysm, dissection, vasculitis or
significant stenosis.

Inflow: Patent without evidence of aneurysm, dissection, vasculitis
or significant stenosis.

Veins: No obvious venous abnormality within the limitations of this
arterial phase study.

Review of the MIP images confirms the above findings.

NON-VASCULAR

Hepatobiliary: No focal liver abnormality is seen. No gallstones,
gallbladder wall thickening, or biliary dilatation.

Pancreas: Unremarkable. No pancreatic ductal dilatation or
surrounding inflammatory changes.

Spleen: Normal in size without focal abnormality.

Adrenals/Urinary Tract: Adrenal glands are unremarkable. Kidneys are
normal, without renal calculi, focal lesion, or hydronephrosis.
Bladder is unremarkable.

Stomach/Bowel: Large paraesophageal hiatal hernia is noted. No
evidence of bowel inflammation or obstruction is noted.
Diverticulosis is noted throughout the transverse and descending
colon without inflammation. The appendix appears normal.

Lymphatic: Aortic atherosclerosis. No enlarged abdominal or pelvic
lymph nodes.

Reproductive: Uterus and bilateral adnexa are unremarkable.

Other: No abdominal wall hernia or abnormality. No abdominopelvic
ascites.

Musculoskeletal: No acute or significant osseous findings.

Review of the MIP images confirms the above findings.
IMPRESSION: Atherosclerosis of thoracic and abdominal aorta is noted without
aneurysm or dissection.

Pulmonary artery enlargement is noted suggesting pulmonary artery
hypertension.

Large paraesophageal hiatal hernia is noted.

Extensive colonic diverticulosis is noted without inflammation.

Moderate focal stenosis is seen involving the origin of the celiac
artery.

Severe stenoses are seen involving the origins of both renal
arteries due to heavily calcified plaque.

## 2016-12-28 IMAGING — CR DG CHEST 2V
2 series · 2 of 2 positions shown · non-contrast
Comparison: None.

CLINICAL DATA: Left chest pain radiating into the neck.

EXAM:
CHEST  2 VIEW

[chest pa]
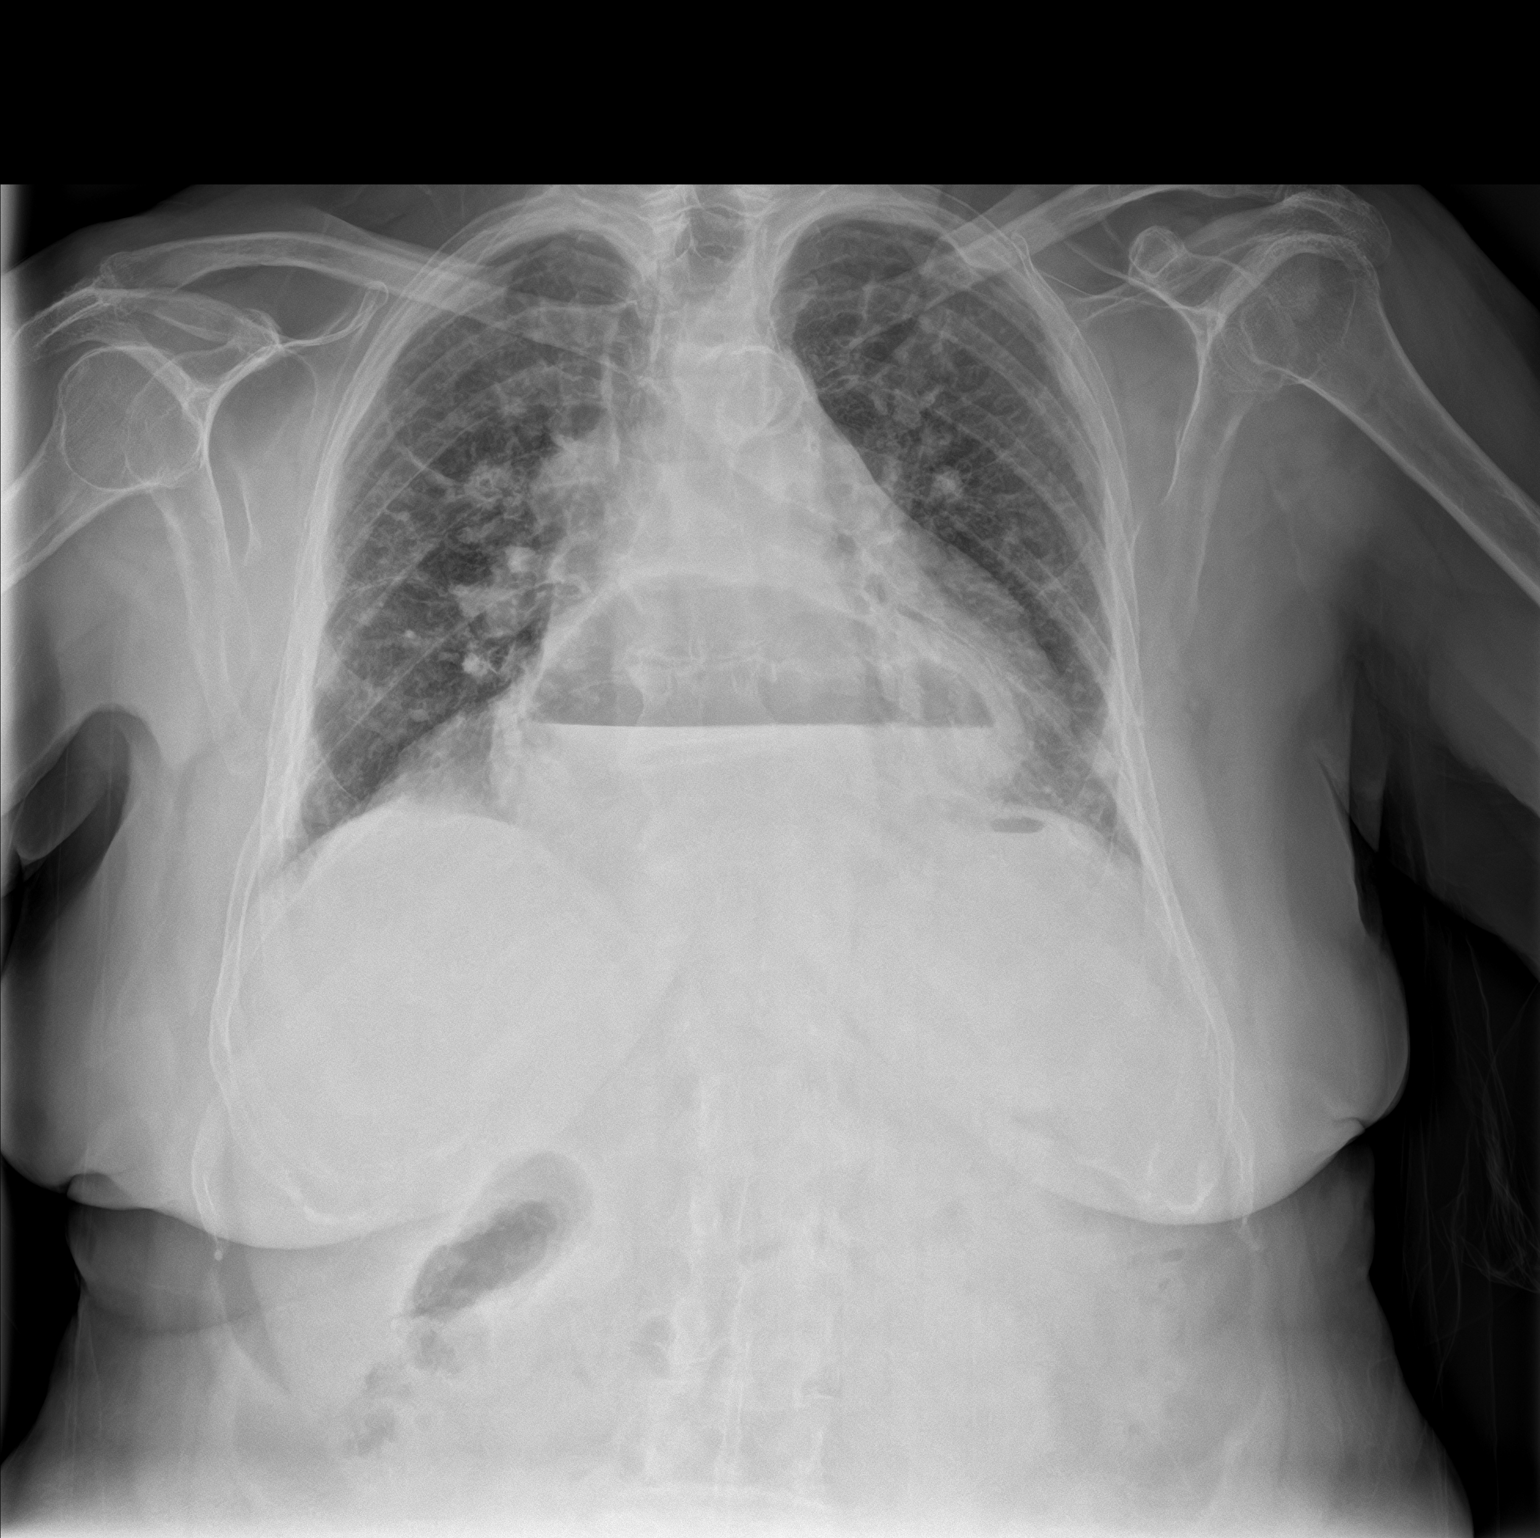

[chest lat]
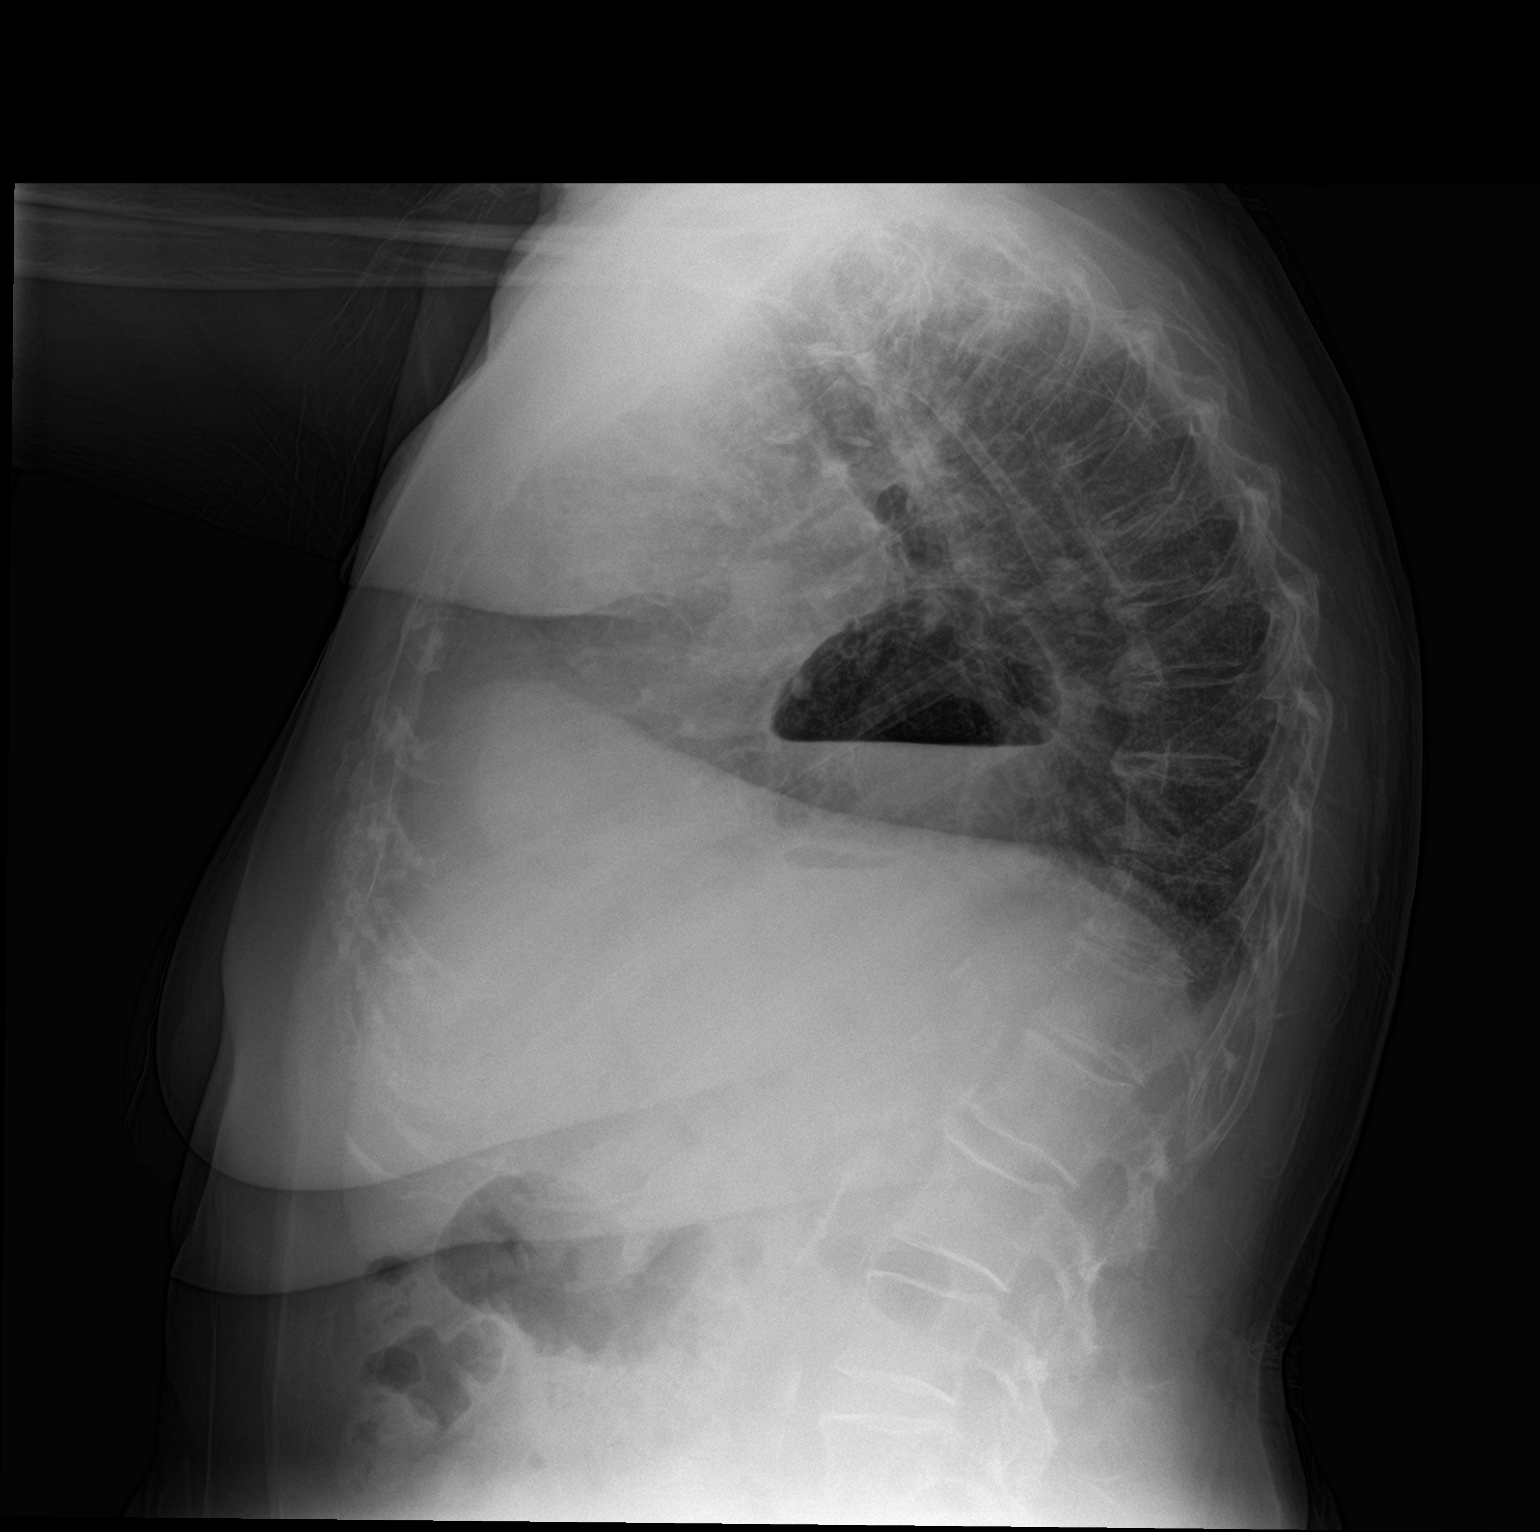

[2 of 2 positions shown; findings below may reference images not displayed]

FINDINGS: There is a very large hiatal hernia with an air-fluid level. Heart
size is mildly enlarged. Central vascular structures and
interstitial lung markings are prominent. No large pleural
effusions. Atherosclerotic calcifications at the aortic arch.
Degenerative changes in lower thoracic spine.
IMPRESSION: Large hiatal hernia.

Prominent central vascular structures and interstitial lung
markings. Findings are suggestive for chronic changes but difficult
to exclude mild edema. No large pleural effusions.

Mild cardiomegaly.

## 2016-12-28 MED ORDER — NITROGLYCERIN 0.4 MG SL SUBL
0.4000 mg | SUBLINGUAL_TABLET | SUBLINGUAL | Status: DC | PRN
  Administered 2016-12-28: 0.4 mg via SUBLINGUAL
  Filled 2016-12-28 (×3): qty 1

## 2016-12-28 MED ORDER — ASPIRIN 81 MG PO CHEW
324.0000 mg | CHEWABLE_TABLET | Freq: Once | ORAL | Status: AC
  Administered 2016-12-28: 324 mg via ORAL

## 2016-12-28 MED ORDER — NITROGLYCERIN 0.4 MG SL SUBL
0.4000 mg | SUBLINGUAL_TABLET | SUBLINGUAL | Status: DC | PRN
  Administered 2016-12-28 (×2): 0.4 mg via SUBLINGUAL

## 2016-12-28 MED ORDER — MORPHINE SULFATE (PF) 4 MG/ML IV SOLN
4.0000 mg | Freq: Once | INTRAVENOUS | Status: AC
  Administered 2016-12-28: 4 mg via INTRAVENOUS

## 2016-12-28 MED ORDER — IOPAMIDOL (ISOVUE-370) INJECTION 76%
75.0000 mL | Freq: Once | INTRAVENOUS | Status: AC | PRN
  Administered 2016-12-28: 75 mL via INTRAVENOUS

## 2016-12-28 MED ORDER — METOCLOPRAMIDE HCL 5 MG/ML IJ SOLN
10.0000 mg | Freq: Once | INTRAMUSCULAR | Status: AC
Start: 2016-12-28 — End: 2016-12-28
  Administered 2016-12-28: 10 mg via INTRAVENOUS
  Filled 2016-12-28: qty 2

## 2016-12-28 MED ORDER — ACETAMINOPHEN 650 MG RE SUPP: 650.0000 mg | Freq: Four times a day (QID) | RECTAL | Status: DC | PRN

## 2016-12-28 MED ORDER — ONDANSETRON HCL 4 MG/2ML IJ SOLN: 4.0000 mg | Freq: Four times a day (QID) | INTRAMUSCULAR | Status: DC | PRN

## 2016-12-28 MED ORDER — ONDANSETRON HCL 4 MG/2ML IJ SOLN
INTRAMUSCULAR | Status: AC
  Filled 2016-12-28: qty 2

## 2016-12-28 MED ORDER — HEPARIN (PORCINE) IN NACL 100-0.45 UNIT/ML-% IJ SOLN
800.0000 [IU]/h | INTRAMUSCULAR | Status: DC
  Administered 2016-12-28: 800 [IU]/h via INTRAVENOUS
  Filled 2016-12-28: qty 250

## 2016-12-28 MED ORDER — ASPIRIN 81 MG PO CHEW
324.0000 mg | CHEWABLE_TABLET | Freq: Once | ORAL | Status: DC
  Filled 2016-12-28: qty 4

## 2016-12-28 MED ORDER — ONDANSETRON HCL 4 MG/2ML IJ SOLN
INTRAMUSCULAR | Status: AC
  Administered 2016-12-28: 4 mg via INTRAVENOUS
  Filled 2016-12-28: qty 2

## 2016-12-28 MED ORDER — MORPHINE SULFATE (PF) 4 MG/ML IV SOLN
INTRAVENOUS | Status: AC
  Administered 2016-12-28: 4 mg via INTRAVENOUS
  Filled 2016-12-28: qty 1

## 2016-12-28 MED ORDER — ONDANSETRON HCL 4 MG/2ML IJ SOLN
4.0000 mg | Freq: Once | INTRAMUSCULAR | Status: AC
  Administered 2016-12-28: 4 mg via INTRAVENOUS

## 2016-12-28 MED ORDER — FLUTICASONE PROPIONATE 50 MCG/ACT NA SUSP
2.0000 | Freq: Every day | NASAL | Status: DC | PRN
  Filled 2016-12-28: qty 16

## 2016-12-28 MED ORDER — PANTOPRAZOLE SODIUM 40 MG PO TBEC
40.0000 mg | DELAYED_RELEASE_TABLET | Freq: Every day | ORAL | Status: DC
  Administered 2016-12-28 – 2016-12-29 (×2): 40 mg via ORAL
  Filled 2016-12-28 (×2): qty 1

## 2016-12-28 MED ORDER — ACETAMINOPHEN 325 MG PO TABS: 650.0000 mg | ORAL_TABLET | Freq: Four times a day (QID) | ORAL | Status: DC | PRN

## 2016-12-28 MED ORDER — FAMOTIDINE IN NACL 20-0.9 MG/50ML-% IV SOLN
20.0000 mg | Freq: Two times a day (BID) | INTRAVENOUS | Status: DC
  Administered 2016-12-28 – 2016-12-29 (×2): 20 mg via INTRAVENOUS
  Filled 2016-12-28 (×3): qty 50

## 2016-12-28 MED ORDER — HEPARIN BOLUS VIA INFUSION
4000.0000 [IU] | Freq: Once | INTRAVENOUS | Status: AC
  Administered 2016-12-28: 4000 [IU] via INTRAVENOUS
  Filled 2016-12-28: qty 4000

## 2016-12-28 MED ORDER — OXYCODONE HCL 5 MG PO TABS: 5.0000 mg | ORAL_TABLET | Freq: Four times a day (QID) | ORAL | Status: DC | PRN

## 2016-12-28 MED ORDER — FAMOTIDINE IN NACL 20-0.9 MG/50ML-% IV SOLN
INTRAVENOUS | Status: AC
  Filled 2016-12-28: qty 50

## 2016-12-28 MED ORDER — GI COCKTAIL ~~LOC~~
30.0000 mL | Freq: Once | ORAL | Status: AC
  Administered 2016-12-28: 30 mL via ORAL
  Filled 2016-12-28: qty 30

## 2016-12-28 MED ORDER — SODIUM CHLORIDE 0.9 % IV BOLUS (SEPSIS)
700.0000 mL | Freq: Once | INTRAVENOUS | Status: AC
  Administered 2016-12-28: 700 mL via INTRAVENOUS

## 2016-12-28 MED ORDER — MORPHINE SULFATE (PF) 4 MG/ML IV SOLN
4.0000 mg | Freq: Once | INTRAVENOUS | Status: AC
  Administered 2016-12-28: 4 mg via INTRAVENOUS
  Filled 2016-12-28: qty 1

## 2016-12-28 MED ORDER — ONDANSETRON HCL 4 MG/2ML IJ SOLN
4.0000 mg | Freq: Once | INTRAMUSCULAR | Status: AC
  Administered 2016-12-28: 4 mg via INTRAVENOUS
  Filled 2016-12-28: qty 2

## 2016-12-28 NOTE — ED Triage Notes (Signed)
Pt in via POV with complaints of left side chest pain radiating into neck beginning approximately one hour ago, states, "its hard to get a good breath."  Pt also reports pain across the top of her abdomen into left ribcage.  Vitals WDL.

## 2016-12-28 NOTE — ED Notes (Addendum)
Jewelery given to daughter at this time. Pt clothes placed in belongings bag

## 2016-12-28 NOTE — Progress Notes (Signed)
ANTICOAGULATION CONSULT NOTE - Initial Consult  Pharmacy Consult for Heparin drip Indication: chest pain/ACS  Allergies  Allergen Reactions  . Egg Donia Pounds, Egg] Rash    Patient Measurements: Height: 5\' 4"  (162.6 cm) Weight: 150 lb (68 kg) IBW/kg (Calculated) : 54.7 Heparin Dosing Weight: 68 kg  Vital Signs: Temp: 97.6 F (36.4 C) (10/04 1501) Temp Source: Oral (10/04 1501) BP: 124/76 (10/04 1905) Pulse Rate: 80 (10/04 1905)  Labs:  Recent Labs  12/28/16 1502 12/28/16 1837 12/28/16 1925  HGB 14.6  --   --   HCT 43.8  --   --   PLT 302  --   --   APTT  --   --  29  LABPROT  --   --  12.5  INR  --   --  0.94  CREATININE 1.22*  --   --   TROPONINI <0.03 <0.03  --     Estimated Creatinine Clearance: 34.3 mL/min (A) (by C-G formula based on SCr of 1.22 mg/dL (H)).   Medical History: Past Medical History:  Diagnosis Date  . GERD (gastroesophageal reflux disease)   . Hyperlipidemia   . Osteoarthritis     Medications:  Scheduled:  . pantoprazole  40 mg Oral Daily   Infusions:  . famotidine (PEPCID) IV    . heparin 800 Units/hr (12/28/16 1947)    Assessment: 83 yo F to start Heparin Drip for ACS/STEMI. Not on anticoagulants PTA per Med [facility].  Hgb 14.6 Plt 302 INR 0.94 APTT 29  Goal of Therapy:  Heparin level 0.3-0.7 units/ml Monitor platelets by anticoagulation protocol: Yes   Plan:  Give 4000 units bolus x 1 Start heparin infusion at 800 units/hr Check anti-Xa level in 8 hours and daily while on heparin Continue to monitor H&H and platelets  Meghanne Pletz A 12/28/2016,8:08 PM

## 2016-12-28 NOTE — ED Notes (Signed)
Pt vomitting black emesis at this time, heparin drip stopped. MD Whitening made aware, orders to give pepcid and d/c heparin drip

## 2016-12-28 NOTE — ED Notes (Signed)
MD Fath , cardiology at bedside at this time

## 2016-12-28 NOTE — ED Notes (Signed)
2L [place] o2 applied at this time, pt 90% on RA after morphine given . Pt in NAD

## 2016-12-28 NOTE — ED Notes (Signed)
Pain 7 to 5 on pain scale after second nitro. Per MD give third, pt BP stable

## 2016-12-28 NOTE — H&P (Signed)
[facility] [place] NAME: Sherry Phillips    MR#:  [id number]  DATE OF BIRTH:  [birth year]  DATE OF ADMISSION:  12/28/2016  PRIMARY CARE PHYSICIAN: [name], MD   REQUESTING/REFERRING PHYSICIAN: Dr Gonzella Lex  CHIEF COMPLAINT:   Chief Complaint  Patient presents with  . Chest Pain    HISTORY OF PRESENT ILLNESS:  Sherry Phillips  is a 83 y.o. female with history of hiatal hernia presents to the ER with continuous chest pain since he ate a banana sandwich after working out in the yard. She thought it was indigestion but 30 minutes later she had a hard time breathing. The pain described as going from her lower abdomen up through her chest into her left neck. She was clammy she vomited. Blood pressure was up and down in the ER morphine was no help. She did get a little better after nitroglycerin. She does have nausea. Pain described as 6-7 out of 10 intensity. She states it's hard to describe the pain. She has her fist over her upper abdomen and lower chest. Patient also recently stopped her Zantac as outpatient takes it more when necessary.  PAST MEDICAL HISTORY:   Past Medical History:  Diagnosis Date  . GERD (gastroesophageal reflux disease)   . Hiatal hernia   . Hyperlipidemia   . Osteoarthritis     PAST SURGICAL HISTORY:   Past Surgical History:  Procedure Laterality Date  . APPENDECTOMY  1962   with ruptured ovary repair  . BREAST BIOPSY  1970  . CATARACT EXTRACTION     both eyes    SOCIAL HISTORY:   Social History  Substance Use Topics  . Smoking status: Never Smoker  . Smokeless tobacco: Never Used  . Alcohol use No    FAMILY HISTORY:   Family History  Problem Relation Age of Onset  . Hypertension Mother   . Leukemia Mother   . COPD Father   . Diabetes Sister        borderline  . Coronary artery disease Brother   . Lung disease Brother   . Cancer Neg Hx        no breast or colon cancer     DRUG ALLERGIES:   Allergies  Allergen Reactions  . Egg White [Albumen, Egg] Rash    REVIEW OF SYSTEMS:  CONSTITUTIONAL: No fever, Positive for clammy sweat. Positive for fatigue.  EYES: No blurred or double vision.  EARS, NOSE, AND THROAT: No tinnitus or ear pain. No sore throat RESPIRATORY: No cough, positive for shortness of breath, no wheezing or hemoptysis.  CARDIOVASCULAR: Positive for chest pain, no orthopnea, edema.  GASTROINTESTINAL: Positive for nausea, vomiting, and abdominal pain. No blood in bowel movements GENITOURINARY: No dysuria, hematuria.  ENDOCRINE: No polyuria, nocturia,  HEMATOLOGY: No anemia, easy bruising or bleeding SKIN: No rash or lesion. MUSCULOSKELETAL: No joint pain or arthritis.   NEUROLOGIC: No tingling, numbness, weakness.  PSYCHIATRY: No anxiety or depression.   MEDICATIONS AT HOME:   Prior to Admission medications   Medication Sig Start Date End Date Taking? Authorizing Provider  fexofenadine (ALLEGRA) 180 MG tablet Take 180 mg by mouth daily as needed.    Yes [provider]  fluticasone (FLONASE) 50 MCG/ACT nasal spray Place 2 sprays into both nostrils daily. In each nostril 05/21/15  Yes [name], MD  Multiple Vitamins-Minerals (PRESERVISION AREDS 2) CAPS Take by mouth daily.   Yes [provider]  ranitidine (ZANTAC)  150 MG tablet Take 150 mg by mouth at bedtime.     Yes [provider]  vitamin B-12 (CYANOCOBALAMIN) 1000 MCG tablet Take 1,000 mcg by mouth daily.   Yes [provider]      VITAL SIGNS:  Blood pressure 124/76, pulse 80, temperature 97.6 F (36.4 C), temperature source Oral, resp. rate 14, height 5\' 4"  (1.626 m), weight 68 kg (150 lb), SpO2 92 %.  PHYSICAL EXAMINATION:  GENERAL:  83 y.o.-year-old patient lying in the bed with no acute distress.  EYES: Pupils equal, round, reactive to light and accommodation. No scleral icterus. Extraocular muscles intact.  HEENT: Head  atraumatic, normocephalic. Oropharynx and nasopharynx clear.  NECK:  Supple, no jugular venous distention. No thyroid enlargement, no tenderness.  LUNGS: Normal breath sounds bilaterally, no wheezing, rales,rhonchi or crepitation. No use of accessory muscles of respiration.  CARDIOVASCULAR: S1, S2 normal. No murmurs, rubs, or gallops. No pain to palpation over the chest or neck. ABDOMEN: Soft, Epigastric tenderness, nondistended. Bowel sounds present. No organomegaly or mass.  EXTREMITIES: No pedal edema, cyanosis, or clubbing.  NEUROLOGIC: Cranial nerves II through XII are intact. Muscle strength 5/5 in all extremities. Sensation intact. Gait not checked.  PSYCHIATRIC: The patient is alert and oriented x 3.  SKIN: No rash, lesion, or ulcer.   LABORATORY PANEL:   CBC  Recent Labs Lab 12/28/16 1502  WBC 9.8  HGB 14.6  HCT 43.8  PLT 302   ------------------------------------------------------------------------------------------------------------------  Chemistries   Recent Labs Lab 12/28/16 1502 12/28/16 1552  NA 141  --   K 4.0  --   CL 105  --   CO2 24  --   GLUCOSE 138*  --   BUN 15  --   CREATININE 1.22*  --   CALCIUM 9.5  --   AST  --  21  ALT  --  15  ALKPHOS  --  90  BILITOT  --  0.8   ------------------------------------------------------------------------------------------------------------------  Cardiac Enzymes  Recent Labs Lab 12/28/16 1837  TROPONINI <0.03   ------------------------------------------------------------------------------------------------------------------  RADIOLOGY:  Dg Chest 2 View  Result Date: 12/28/2016 CLINICAL DATA:  Left chest pain radiating into the neck. EXAM: CHEST  2 VIEW COMPARISON:  None. FINDINGS: There is a very large hiatal hernia with an air-fluid level. Heart size is mildly enlarged. Central vascular structures and interstitial lung markings are prominent. No large pleural effusions. Atherosclerotic calcifications  at the aortic arch. Degenerative changes in lower thoracic spine. IMPRESSION: Large hiatal hernia. Prominent central vascular structures and interstitial lung markings. Findings are suggestive for chronic changes but difficult to exclude mild edema. No large pleural effusions. Mild cardiomegaly. Electronically Signed   By: Markus Daft M.D.   On: 12/28/2016 16:08   Ct Angio Chest/abd/pel For Dissection W And/or Wo Contrast  Result Date: 12/28/2016 CLINICAL DATA:  Chest and abdominal pain. EXAM: CT ANGIOGRAPHY CHEST, ABDOMEN AND PELVIS TECHNIQUE: Multidetector CT imaging through the chest, abdomen and pelvis was performed using the standard protocol during bolus administration of intravenous contrast. Multiplanar reconstructed images and MIPs were obtained and reviewed to evaluate the vascular anatomy. CONTRAST:  75 mL of Isovue 370 intravenously. COMPARISON:  CT scan of March 23, 2010. FINDINGS: CTA CHEST FINDINGS Cardiovascular: Atherosclerosis of thoracic aorta is noted without aneurysm or dissection. Great vessels are widely patent without significant stenosis. Mild cardiomegaly is noted. No pericardial effusion is noted. Pulmonary arteries are enlarged suggesting pulmonary artery hypertension. Mediastinum/Nodes: Large paraesophageal hiatal hernia is noted. Thyroid gland is  unremarkable. No adenopathy is noted. Lungs/Pleura: Lungs are clear. No pleural effusion or pneumothorax. Musculoskeletal: No chest wall abnormality. No acute or significant osseous findings. Review of the MIP images confirms the above findings. CTA ABDOMEN AND PELVIS FINDINGS VASCULAR Aorta: Atherosclerosis of abdominal aorta is noted without aneurysm or dissection. Celiac: Moderate focal stenosis is noted at origin secondary to calcified plaque. SMA: Patent without evidence of aneurysm, dissection, vasculitis or significant stenosis. Renals: Heavily calcified plaque is noted at the origins of both renal arteries resulting in severe  stenosis. IMA: Patent without evidence of aneurysm, dissection, vasculitis or significant stenosis. Inflow: Patent without evidence of aneurysm, dissection, vasculitis or significant stenosis. Veins: No obvious venous abnormality within the limitations of this arterial phase study. Review of the MIP images confirms the above findings. NON-VASCULAR Hepatobiliary: No focal liver abnormality is seen. No gallstones, gallbladder wall thickening, or biliary dilatation. Pancreas: Unremarkable. No pancreatic ductal dilatation or surrounding inflammatory changes. Spleen: Normal in size without focal abnormality. Adrenals/Urinary Tract: Adrenal glands are unremarkable. Kidneys are normal, without renal calculi, focal lesion, or hydronephrosis. Bladder is unremarkable. Stomach/Bowel: Large paraesophageal hiatal hernia is noted. No evidence of bowel inflammation or obstruction is noted. Diverticulosis is noted throughout the transverse and descending colon without inflammation. The appendix appears normal. Lymphatic: Aortic atherosclerosis. No enlarged abdominal or pelvic lymph nodes. Reproductive: Uterus and bilateral adnexa are unremarkable. Other: No abdominal wall hernia or abnormality. No abdominopelvic ascites. Musculoskeletal: No acute or significant osseous findings. Review of the MIP images confirms the above findings. IMPRESSION: Atherosclerosis of thoracic and abdominal aorta is noted without aneurysm or dissection. Pulmonary artery enlargement is noted suggesting pulmonary artery hypertension. Large paraesophageal hiatal hernia is noted. Extensive colonic diverticulosis is noted without inflammation. Moderate focal stenosis is seen involving the origin of the celiac artery. Severe stenoses are seen involving the origins of both renal arteries due to heavily calcified plaque. Electronically Signed   By: Marijo Conception, M.D.   On: 12/28/2016 16:50    EKG:   3 EKG were done. 2 EKG showed flipped T waves  inferiorly and flattening of T waves laterally  IMPRESSION AND PLAN:   1. Chest pain and abdominal pain. CT angiogram of the chest was negative for dissection. Large hiatal hernia seen. 2 cardiac enzymes are negative. Case discussed with Dr. Ubaldo Glassing surgery he will put on heparin drip for right now and decide on testing tomorrow morning either stress test or cardiac catheterization. I think this could possibly be secondary to the hiatal hernia since it did improve with the nitroglycerin and no help with morphine. Will give Pepcid and Protonix and see how she is tomorrow. 2. Chronic kidney disease stage III. 3. Macular degeneration  All the records are reviewed and case discussed with ED provider. Management plans discussed with the patient, family and they are in agreement.  CODE STATUS: Full code  TOTAL TIME TAKING CARE OF THIS PATIENT: 50 minutes.    Loletha Grayer M.D on 12/28/2016 at 8:13 PM  Between 7am to 6pm - Pager - [phone]  After 6pm call admission pager [phone]  [facility]  [phone]  CC: Primary care physician; [name], MD

## 2016-12-28 NOTE — Progress Notes (Addendum)
Patient ID: Sherry Phillips, female   DOB: 1933-12-07, 83 y.o.   MRN: [id number]  Notified of black emesis. Stop heparin drip. Check third troponin. GI consultation. IV Pepcid and Protonix ordered  Dr. Leslye Peer

## 2016-12-28 NOTE — Consult Note (Signed)
[facility]  CARDIOLOGY CONSULT NOTE  Patient ID: Sherry Phillips MRN: [id number] DOB/AGE: 81/08/1933 81 y.o.  Admit date: 12/28/2016 Referring Physician Dr. Leslye Peer Primary Physician Dr. Silvio Pate Primary Cardiologist   Reason for Consultation chest pain  HPI: Pt is an 83 yo female with no medical problems who presented to the er with acute onset of chest pain and abdominal pain with radiation to her neck and jaw. She had not had pain like that previously. In the er, her ekg showed what appears to be inferior q waves. There was no acute injury pattern. Her initial two troponins were normal. Her pain improved with iv nitrates, iv nitro and asa. EKG has not changed. She had some diapharesis with her pain.   Review of Systems  Constitutional: Negative.   HENT: Negative.   Eyes: Negative.   Respiratory: Negative.   Cardiovascular: Positive for chest pain.  Gastrointestinal: Positive for abdominal pain.  Genitourinary: Negative.   Musculoskeletal: Negative.   Skin: Negative.   Neurological: Negative.   Endo/Heme/Allergies: Negative.   Psychiatric/Behavioral: Negative.     Past Medical History:  Diagnosis Date  . GERD (gastroesophageal reflux disease)   . Hiatal hernia   . Hyperlipidemia   . Osteoarthritis     Family History  Problem Relation Age of Onset  . Hypertension Mother   . Leukemia Mother   . COPD Father   . Diabetes Sister        borderline  . Coronary artery disease Brother   . Lung disease Brother   . Cancer Neg Hx        no breast or colon cancer    Social History   Social History  . Marital status: Widowed    Spouse name: N/A  . Number of children: 2  . Years of education: N/A   Occupational History  . homemaker    Social History Main Topics  . Smoking status: Never Smoker  . Smokeless tobacco: Never Used  . Alcohol use No  . Drug use: No  . Sexual activity: No   Other Topics Concern  .  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   One son and one daughter, [name]   Has living will.  Requests daughter to make decisions if needed.    Would accept CPR at this point, but no prolonged artificial ventilation.     Would not want tube feeds.    Past Surgical History:  Procedure Laterality Date  . APPENDECTOMY  1962   with ruptured ovary repair  . BREAST BIOPSY  1970  . CATARACT EXTRACTION     both eyes      (Not in a hospital admission)  Physical Exam: Blood pressure (!) 154/80, pulse 98, temperature 97.6 F (36.4 C), temperature source Oral, resp. rate 19, height 5\' 4"  (1.626 m), weight 68 kg (150 lb), SpO2 97 %.   Wt Readings from Last 1 Encounters:  12/28/16 68 kg (150 lb)     General appearance: alert and cooperative Resp: clear to auscultation bilaterally Cardio: regular rate and rhythm GI: abnormal findings:  mild tenderness in the epigastrium Extremities: extremities normal, atraumatic, no cyanosis or edema Neurologic: Grossly normal  Labs:   Lab Results  Component Value Date   WBC 9.8 12/28/2016   HGB 14.6 12/28/2016   HCT 43.8 12/28/2016   MCV 85.0 12/28/2016   PLT 302 12/28/2016  Recent Labs Lab 12/28/16 1502 12/28/16 1552  NA 141  --   K 4.0  --   CL 105  --   CO2 24  --   BUN 15  --   CREATININE 1.22*  --   CALCIUM 9.5  --   PROT  --  7.2  BILITOT  --  0.8  ALKPHOS  --  90  ALT  --  15  AST  --  21  GLUCOSE 138*  --    Lab Results  Component Value Date   TROPONINI <0.03 12/28/2016      Radiology: no pulmonary embolus or disection  EKG: nsr with inferior q waves  ASSESSMENT AND PLAN:  83 yo with no prior cardiac history who presented to the er with several hours of chet pain. Has ruled out for mi. Etiology of pain unclear but concerning for ischemic etiology. Will continue iwht heparin, iv nitrates, asa, beta blocker and statin. Plan cath in am to evaluate anatomy to guide therapy based  on course.  Signed: Teodoro Spray MD, [facility] 12/28/2016, 8:53 PM

## 2016-12-28 NOTE — ED Provider Notes (Signed)
[facility] Emergency Department Provider Note  ____________________________________________  Time seen: Approximately 3:50 PM  I have reviewed the triage vital signs and the nursing notes.   HISTORY  Chief Complaint Chest Pain   HPI Sherry Phillips is a 83 y.o. female with a history of GERD, hyperlipidemia, and osteoarthritis who presents for evaluation of chest pain. Patient reports that she had just eaten a banana and went outside to mow the lawn when she starting having pain she describes as dull, severe, located in the left side of her chest, radiating to her neck and all the way down to her abdomen. She thought it was maybe indigestion and sat down for a little then however she started vomiting and felt very nauseated. She also started feeling short of breath which prompted her visit to the emergency room. She denies ever having similar pain. The pain is currently 7 out of 10 and she continues to endorse shortness of breath. She denies any personal or family history of dissection, collagen disorders, PE or DVT. she has a brother with a history of cardiac disease. She is not a smoker. She does not take any daily medications at home. No diarrhea, constipation, dysuria, hematuria, cough, congestion. No leg pain or swelling, no exogenous hormones. No paresthesias of her extremities.  Past Medical History:  Diagnosis Date  . GERD (gastroesophageal reflux disease)   . Hiatal hernia   . Hyperlipidemia   . Osteoarthritis     Patient Active Problem List   Diagnosis Date Noted  . Chest pain 12/28/2016  . Viral infection 04/26/2016  . Insect bite 10/14/2015  . Allergic rhinitis due to pollen 05/21/2015  . Vitamin B 12 deficiency 05/21/2015  . Fatigue 11/23/2014  . Contact dermatitis due to plant 11/05/2014  . Edema 10/31/2010  . Elevated blood-pressure reading without diagnosis of hypertension 10/31/2010  . HYPERLIPIDEMIA 04/13/2010  . GERD  04/13/2010  . OSTEOARTHRITIS 04/13/2010    Past Surgical History:  Procedure Laterality Date  . APPENDECTOMY  1962   with ruptured ovary repair  . BREAST BIOPSY  1970  . CATARACT EXTRACTION     both eyes    Prior to Admission medications   Medication Sig Start Date End Date Taking? Authorizing Provider  fexofenadine (ALLEGRA) 180 MG tablet Take 180 mg by mouth daily as needed.    Yes [provider]  fluticasone (FLONASE) 50 MCG/ACT nasal spray Place 2 sprays into both nostrils daily. In each nostril 05/21/15  Yes [name], MD  Multiple Vitamins-Minerals (PRESERVISION AREDS 2) CAPS Take by mouth daily.   Yes [provider]  ranitidine (ZANTAC) 150 MG tablet Take 150 mg by mouth at bedtime.     Yes [provider]  vitamin B-12 (CYANOCOBALAMIN) 1000 MCG tablet Take 1,000 mcg by mouth daily.   Yes [provider]    Allergies Egg white [albumen, egg]  Family History  Problem Relation Age of Onset  . Hypertension Mother   . Leukemia Mother   . COPD Father   . Diabetes Sister        borderline  . Coronary artery disease Brother   . Lung disease Brother   . Cancer Neg Hx        no breast or colon cancer    Social History Social History  Substance Use Topics  . Smoking status: Never Smoker  . Smokeless tobacco: Never Used  . Alcohol use No    Review of Systems  Constitutional:  Negative for fever. Eyes: Negative for visual changes. ENT: Negative for sore throat. Neck: + neck pain  Cardiovascular: + chest pain. Respiratory: + shortness of breath. Gastrointestinal: + L sided abdominal pain, nausea, and vomiting. No diarrhea. Genitourinary: Negative for dysuria. Musculoskeletal: Negative for back pain. Skin: Negative for rash. Neurological: Negative for headaches, weakness or numbness. Psych: No SI or HI  ____________________________________________   PHYSICAL EXAM:  VITAL SIGNS: ED Triage Vitals  Enc Vitals Group       BP 12/28/16 1501 (!) 159/71     Pulse Rate 12/28/16 1501 71     Resp 12/28/16 1501 20     Temp 12/28/16 1501 97.6 F (36.4 C)     Temp Source 12/28/16 1501 Oral     SpO2 12/28/16 1501 96 %     Weight 12/28/16 1502 150 lb (68 kg)     Height 12/28/16 1502 5\' 4"  (1.626 m)     Head Circumference --      Peak Flow --      Pain Score 12/28/16 1501 10     Pain Loc --      Pain Edu? --      Excl. in [place]? --     Constitutional: Alert and oriented. Well appearing and in no apparent distress. HEENT:      Head: Normocephalic and atraumatic.         Eyes: Conjunctivae are normal. Sclera is non-icteric.       Mouth/Throat: Mucous membranes are moist.       Neck: Supple with no signs of meningismus. No bruits Cardiovascular: Regular rate and rhythm. No murmurs, gallops, or rubs. 2+ symmetrical distal pulses are present in all extremities. No JVD. Respiratory: Normal respiratory effort. Lungs are clear to auscultation bilaterally. No wheezes, crackles, or rhonchi.  Gastrointestinal: Soft, non tender, and non distended with positive bowel sounds. No rebound or guarding. Genitourinary: No CVA tenderness. Musculoskeletal: Nontender with normal range of motion in all extremities. No edema, cyanosis, or erythema of extremities. Neurologic: Normal speech and language. Face is symmetric. Moving all extremities. No gross focal neurologic deficits are appreciated. Skin: Skin is warm, dry and intact. No rash noted. Psychiatric: Mood and affect are normal. Speech and behavior are normal.  ____________________________________________   LABS (all labs ordered are listed, but only abnormal results are displayed)  Labs Reviewed  BASIC METABOLIC PANEL - Abnormal; Notable for the following:       Result Value   Glucose, Bld 138 (*)    Creatinine, Ser 1.22 (*)    GFR calc non Af Amer 40 (*)    GFR calc Af Amer 47 (*)    All other components within normal limits  CBC - Abnormal; Notable for the  following:    RDW 15.1 (*)    All other components within normal limits  HEPATIC FUNCTION PANEL - Abnormal; Notable for the following:    Bilirubin, Direct <0.1 (*)    All other components within normal limits  TROPONIN I  TROPONIN I  PROTIME-INR  APTT  TROPONIN I  BASIC METABOLIC PANEL  CBC  LIPID PANEL   ____________________________________________  EKG  ED ECG REPORT I, Rudene Re, the attending physician, personally viewed and interpreted this ECG.  Normal sinus rhythm, rate of 78, normal intervals, normal axis, T-wave flattening in inferior and lateral leads, no ST elevation or depression.no prior for comparison.  16:56 - normal sinus rhythm, rate of 88, normal intervals, and axis deviation, Q waves in inferior  leads, no ST elevations or depressions. Q waves are new when compared to EKG done 2 hours ago.  18:14 - sinus rhythm, LVH, normal intervals, right axis deviation, persistent Q waves in inferior leads with T-wave inversions. Unchanged from second EKG. ____________________________________________  RADIOLOGY  CTA dissection: Atherosclerosis of thoracic and abdominal aorta is noted without aneurysm or dissection.  Pulmonary artery enlargement is noted suggesting pulmonary artery hypertension.  Large paraesophageal hiatal hernia is noted.  Extensive colonic diverticulosis is noted without inflammation.  Moderate focal stenosis is seen involving the origin of the celiac artery.  Severe stenoses are seen involving the origins of both renal arteries due to heavily calcified plaque. ____________________________________________   PROCEDURES  Procedure(s) performed: None Procedures Critical Care performed: yes  CRITICAL CARE Performed by: Rudene Re  ?  Total critical care time: 40 min  Critical care time was exclusive of separately billable procedures and treating other patients.  Critical care was necessary to treat or prevent imminent  or life-threatening deterioration.  Critical care was time spent personally by me on the following activities: development of treatment plan with patient and/or surrogate as well as nursing, discussions with consultants, evaluation of patient's response to treatment, examination of patient, obtaining history from patient or surrogate, ordering and performing treatments and interventions, ordering and review of laboratory studies, ordering and review of radiographic studies, pulse oximetry and re-evaluation of patient's condition.  ____________________________________________   INITIAL IMPRESSION / ASSESSMENT AND PLAN / ED COURSE  84 y.o. female with a history of GERD, hyperlipidemia, and osteoarthritis who presents for evaluation of sudden onset of L sided dull pain on L side of her chest, abdomen, and neck associated with SOB, N/V. patient is well-appearing and in no distress, her EKG shows no ischemic changes. She has no bruits and her neurological exam is intact, she has strong equal pulses in all 4 extremities, she has no murmurs, her lungs are clear to auscultation, her abdomen is soft with no pulsatile mass or tenderness. Differential diagnoses including dissection versus ACS versus GERD versus PE. We'll send patient for CT angiogram. We'll give morphine, Zofran, and fluids for symptom relief. Labs are pending.    _________________________ 6:25 PM on 12/28/2016 -----------------------------------------  CT with no evidence of dissection or PE. Patient does have a very large. Esophageal hernia which could be causing her symptoms. She has received 2 rounds of morphine with no improvement of her pain. I did repeat an EKG which is markedly different than the one done in triage however does not look ischemic. The 2nd EKG is so different than the 1st one, that I repeated a third one which looks identical to the second one. Unclear if these were done in different machines since they looks so different  however there is no evidence of STEMI. I consulted Dr. Ubaldo Glassing who looked at th EKGs and also agrees that there is no evidence of stemi since patient looks unwell with intermittent episodes of diaphoresis/ nausea/vomiting. We will consult Dr. Clayborn Bigness who is on for cath lab for possible cath today. Will give ASA and nitro  _________________________ 7:11 PM on 12/28/2016 -----------------------------------------  Patient pain markedly improved after sublingual nitro. 2nd troponin pending. Plan per Dr. Ubaldo Glassing and Dr. Clayborn Bigness to start patient on heparin and admit to Hospitalist. If 2nd troponin is positive cath tonight if not will plan for cath in the am.  Pertinent labs & imaging results that were available during my care of the patient were reviewed by me and considered in  my medical decision making (see chart for details).    ____________________________________________   FINAL CLINICAL IMPRESSION(S) / ED DIAGNOSES  Final diagnoses:  Unstable angina ([place])      NEW MEDICATIONS STARTED DURING THIS VISIT:  Current Discharge Medication List       Note:  This document was prepared using Dragon voice recognition software and may include unintentional dictation errors.    Rudene Re, MD 12/28/16 [phone]

## 2016-12-29 ENCOUNTER — Encounter
Admission: EM | Disposition: A | Discharge status: Home / Self Care | Payer: Self-pay | Source: Home / Self Care | Attending: Emergency Medicine

## 2016-12-29 DIAGNOSIS — R079 Chest pain, unspecified: Secondary | ICD-10-CM | POA: Diagnosis not present

## 2016-12-29 DIAGNOSIS — K920 Hematemesis: Secondary | ICD-10-CM | POA: Diagnosis not present

## 2016-12-29 DIAGNOSIS — R0789 Other chest pain: Secondary | ICD-10-CM | POA: Diagnosis not present

## 2016-12-29 DIAGNOSIS — N183 Chronic kidney disease, stage 3 (moderate): Secondary | ICD-10-CM | POA: Diagnosis not present

## 2016-12-29 DIAGNOSIS — K449 Diaphragmatic hernia without obstruction or gangrene: Secondary | ICD-10-CM | POA: Diagnosis not present

## 2016-12-29 LAB — BASIC METABOLIC PANEL
ANION GAP: 6 (ref 5–15)
BUN: 17 mg/dL (ref 6–20)
CO2: 28 mmol/L (ref 22–32)
Calcium: 8.8 mg/dL — ABNORMAL LOW (ref 8.9–10.3)
Chloride: 106 mmol/L (ref 101–111)
Creatinine, Ser: 1.19 mg/dL — ABNORMAL HIGH (ref 0.44–1.00)
GFR, EST AFRICAN AMERICAN: 48 mL/min — AB (ref >60)
GFR, EST NON AFRICAN AMERICAN: 42 mL/min — AB (ref >60)
Glucose, Bld: 121 mg/dL — ABNORMAL HIGH (ref 65–99)
POTASSIUM: 4.4 mmol/L (ref 3.5–5.1)
SODIUM: 140 mmol/L (ref 135–145)

## 2016-12-29 LAB — CBC
HEMATOCRIT: 40.6 % (ref 35.0–47.0)
Hemoglobin: 13.6 g/dL (ref 12.0–16.0)
MCH: 28.8 pg (ref 26.0–34.0)
MCHC: 33.4 g/dL (ref 32.0–36.0)
MCV: 86.2 fL (ref 80.0–100.0)
Platelets: 271 10*3/uL (ref 150–440)
RBC: 4.71 MIL/uL (ref 3.80–5.20)
RDW: 15.1 % — AB (ref 11.5–14.5)
WBC: 12.4 10*3/uL — AB (ref 3.6–11.0)

## 2016-12-29 LAB — LIPID PANEL
CHOL/HDL RATIO: 5.9 ratio
Cholesterol: 266 mg/dL — ABNORMAL HIGH (ref 0–200)
HDL: 45 mg/dL (ref >40)
LDL CALC: 190 mg/dL — AB (ref 0–99)
TRIGLYCERIDES: 153 mg/dL — AB (ref <150)
VLDL: 31 mg/dL (ref 0–40)

## 2016-12-29 SURGERY — LEFT HEART CATH AND CORONARY ANGIOGRAPHY
Anesthesia: Moderate Sedation

## 2016-12-29 MED ORDER — ATORVASTATIN CALCIUM 40 MG PO TABS: 40.0000 mg | ORAL_TABLET | Freq: Every day | ORAL | 11 refills | Status: DC

## 2016-12-29 NOTE — Progress Notes (Signed)
Noted black emesis. Pt has ruled out for mi. Will defer invasive cardiac evaluation at present and proceed with gi workup. Abdominal pain appears to be non ischemic with normal troponini times three. Agree with stopping heparin and asa.

## 2016-12-29 NOTE — Progress Notes (Signed)
Per Dr. Vianne Bulls awaiting GI consult and then pt may potentially be discharged. Informed her that GI has no one on call today. She stated that the patient is ok to be discharged home and should have a follow up with her primary care physician next week. Communicated this to patient and her family members who are agreeable to this plan. Pt is discharging to home with family. IV's and heart monitor removed. Discharge instructions and education reviewed and provided for patient and family. Pt taken to family member's vehicle via wheelchair.

## 2016-12-29 NOTE — Care Management Obs Status (Signed)
[place] NOTIFICATION   Patient Details  Name: Sherry Phillips MRN: [id number] Date of Birth: 09/28/1933   Medicare Observation Status Notification Given:  Yes  Notice signed, one given to patient and the other to HIM for scanning   Katrina Stack, RN 12/29/2016, 12:39 PM

## 2016-12-29 NOTE — Discharge Summary (Addendum)
Sherry Phillips, is a 83 y.o. female  DOB 1933/04/12  MRN [id number].  Admission date:  12/28/2016  Admitting Physician  Loletha Grayer, MD  Discharge Date:  12/29/2016   Primary MD  [name], MD  Recommendations for primary care physician for things to follow:    Follow-up with PCP in one week  Admission Diagnosis  Unstable angina ([place]) [I20.0] Chest pain [R07.9]   Discharge Diagnosis  Unstable angina ([place]) [I20.0] Chest pain [R07.9]    Active Problems:   Chest pain      Past Medical History:  Diagnosis Date  . GERD (gastroesophageal reflux disease)   . Hiatal hernia   . Hyperlipidemia   . Osteoarthritis     Past Surgical History:  Procedure Laterality Date  . APPENDECTOMY  1962   with ruptured ovary repair  . BREAST BIOPSY  1970  . CATARACT EXTRACTION     both eyes       History of present illness and  Hospital Course:     Kindly see H&P for history of present illness and admission details, please review complete Labs, Consult reports and Test reports for all details in brief  HPI  from the history and physical done on the day of admission 83 year old female patient from admitted for chest pain, admitted to telemetry for chest pain evaluation, started on heparin drip.   Hospital Course  #1 atypical chest pain following troponins are negative for 3 times. EKG nondiagnostic without any ST-T changes.started on heparin drip. Patient had a coffee-ground vomiting; heparin drip is stopped. And initial plan is to take and the patient to cardiac But this is also canceled because patient troponins have been negative and did not have any chest pain or EKG changes. Cardiology felt her chest pain is coming acute gastritis and GERD rather than cardiac even His pain thought to be secondary to acute  gastritis. LDL is 190. Started on i statins.  #2 coffee-ground vomiting: Resolved after stopping heparin drip. Patient hemoglobin stayed around thirteen.  . Did not have any further episodes of coffee-ground vomiting after  episode of vomiting in the emergency room. Patient received IV Pepcid. Gastroenterology consult is requested. Patient feels completely normal. Patient has a history of GERD and on PPIs before. I will discharge her home with daily PPIs. Waiting for gastroenterology to see the patient. Patient's family wanted her to see gastroenterology before the discharge. Continue Tylenol only for pain control ./ Advised pt to avoid NSAID contaning products.  Discharge Condition: stable   Follow UP      Discharge Instructions  and  Discharge Medications     Allergies as of 12/29/2016      Reactions   Egg White [albumen, Egg] Rash      Medication List    TAKE these medications   fexofenadine 180 MG tablet Commonly known as:  ALLEGRA Take 180 mg by mouth daily as needed.   fluticasone 50 MCG/ACT nasal spray Commonly known as:  FLONASE Place 2 sprays into both nostrils daily. In each nostril   PRESERVISION AREDS 2 Caps Take by mouth daily.   ranitidine 150 MG tablet Commonly known as:  ZANTAC Take 150 mg by mouth at bedtime.   vitamin B-12 1000 MCG tablet Commonly known as:  CYANOCOBALAMIN Take 1,000 mcg by mouth daily.         Diet and Activity recommendation: See Discharge Instructions above   Consults obtained -gastroenterology   Major procedures and Radiology Reports -  PLEASE review detailed and final reports for all details, in brief -      Dg Chest 2 View  Result Date: 12/28/2016 CLINICAL DATA:  Left chest pain radiating into the neck. EXAM: CHEST  2 VIEW COMPARISON:  None. FINDINGS: There is a very large hiatal hernia with an air-fluid level. Heart size is mildly enlarged. Central vascular structures and interstitial lung markings are prominent.  No large pleural effusions. Atherosclerotic calcifications at the aortic arch. Degenerative changes in lower thoracic spine. IMPRESSION: Large hiatal hernia. Prominent central vascular structures and interstitial lung markings. Findings are suggestive for chronic changes but difficult to exclude mild edema. No large pleural effusions. Mild cardiomegaly. Electronically Signed   By: Markus Daft M.D.   On: 12/28/2016 16:08   Ct Angio Chest/abd/pel For Dissection W And/or Wo Contrast  Result Date: 12/28/2016 CLINICAL DATA:  Chest and abdominal pain. EXAM: CT ANGIOGRAPHY CHEST, ABDOMEN AND PELVIS TECHNIQUE: Multidetector CT imaging through the chest, abdomen and pelvis was performed using the standard protocol during bolus administration of intravenous contrast. Multiplanar reconstructed images and MIPs were obtained and reviewed to evaluate the vascular anatomy. CONTRAST:  75 mL of Isovue 370 intravenously. COMPARISON:  CT scan of March 23, 2010. FINDINGS: CTA CHEST FINDINGS Cardiovascular: Atherosclerosis of thoracic aorta is noted without aneurysm or dissection. Great vessels are widely patent without significant stenosis. Mild cardiomegaly is noted. No pericardial effusion is noted. Pulmonary arteries are enlarged suggesting pulmonary artery hypertension. Mediastinum/Nodes: Large paraesophageal hiatal hernia is noted. Thyroid gland is unremarkable. No adenopathy is noted. Lungs/Pleura: Lungs are clear. No pleural effusion or pneumothorax. Musculoskeletal: No chest wall abnormality. No acute or significant osseous findings. Review of the MIP images confirms the above findings. CTA ABDOMEN AND PELVIS FINDINGS VASCULAR Aorta: Atherosclerosis of abdominal aorta is noted without aneurysm or dissection. Celiac: Moderate focal stenosis is noted at origin secondary to calcified plaque. SMA: Patent without evidence of aneurysm, dissection, vasculitis or significant stenosis. Renals: Heavily calcified plaque is noted at  the origins of both renal arteries resulting in severe stenosis. IMA: Patent without evidence of aneurysm, dissection, vasculitis or significant stenosis. Inflow: Patent without evidence of aneurysm, dissection, vasculitis or significant stenosis. Veins: No obvious venous abnormality within the limitations of this arterial phase study. Review of the MIP images confirms the above findings. NON-VASCULAR Hepatobiliary: No focal liver abnormality is seen. No gallstones, gallbladder wall thickening, or biliary dilatation. Pancreas: Unremarkable. No pancreatic ductal dilatation or surrounding inflammatory changes. Spleen: Normal in size without focal abnormality. Adrenals/Urinary Tract: Adrenal glands are unremarkable. Kidneys are normal, without renal calculi, focal lesion, or hydronephrosis. Bladder is unremarkable. Stomach/Bowel: Large paraesophageal hiatal hernia is noted. No evidence of bowel inflammation or obstruction is noted. Diverticulosis is noted throughout the transverse and descending colon without inflammation. The appendix appears normal. Lymphatic: Aortic atherosclerosis. No enlarged abdominal or pelvic lymph nodes. Reproductive: Uterus and bilateral adnexa are unremarkable. Other: No abdominal wall hernia or abnormality. No abdominopelvic ascites. Musculoskeletal: No acute or significant osseous findings. Review of the MIP images confirms the above findings. IMPRESSION: Atherosclerosis of thoracic and abdominal aorta is noted without aneurysm or dissection. Pulmonary artery enlargement is noted suggesting pulmonary artery hypertension. Large paraesophageal hiatal hernia is noted. Extensive colonic diverticulosis is noted without inflammation. Moderate focal stenosis is seen involving the origin of the celiac artery. Severe stenoses are seen involving the origins of both renal arteries due to heavily calcified plaque. Electronically Signed   By: Marijo Conception, M.D.   On: 12/28/2016  16:50    Micro  Results     No results found for this or any previous visit (from the past 240 hour(s)).     Today   Subjective:   Sherry Phillips today has no headache,no chest abdominal pain,no new weakness tingling or numbness, feels much better wants to go home today. *  Objective:   Blood pressure (!) 147/54, pulse 80, temperature 98.3 F (36.8 C), temperature source Oral, resp. rate 16, height 5\' 4"  (1.626 m), weight 69.9 kg (154 lb), SpO2 96 %.   Intake/Output Summary (Last 24 hours) at 12/29/16 1359 Last data filed at 12/29/16 1104  Gross per 24 hour  Intake               50 ml  Output              700 ml  Net             -650 ml    Exam Awake Alert, Oriented x 3, No new F.N deficits, Normal affect [place].AT,PERRAL Supple Neck,No JVD, No cervical lymphadenopathy appriciated.  Symmetrical Chest wall movement, Good air movement bilaterally, CTAB RRR,No Gallops,Rubs or new Murmurs, No Parasternal Heave +ve B.Sounds, Abd Soft, Non tender, No organomegaly appriciated, No rebound -guarding or rigidity. No Cyanosis, Clubbing or edema, No new Rash or bruise  Data Review   CBC w Diff:  Lab Results  Component Value Date   WBC 12.4 (H) 12/29/2016   HGB 13.6 12/29/2016   HCT 40.6 12/29/2016   PLT 271 12/29/2016   LYMPHOPCT 25.1 09/28/2016   MONOPCT 12.0 09/28/2016   EOSPCT 5.3 (H) 09/28/2016   BASOPCT 1.8 09/28/2016    CMP:  Lab Results  Component Value Date   NA 140 12/29/2016   K 4.4 12/29/2016   CL 106 12/29/2016   CO2 28 12/29/2016   BUN 17 12/29/2016   CREATININE 1.19 (H) 12/29/2016   PROT 7.2 12/28/2016   ALBUMIN 4.0 12/28/2016   BILITOT 0.8 12/28/2016   ALKPHOS 90 12/28/2016   AST 21 12/28/2016   ALT 15 12/28/2016  .   Total Time in preparing paper work, data evaluation and todays exam - 79 minutes  Ewald Beg M.D on 12/29/2016 at 1:59 PM    Note: This dictation was prepared with Dragon dictation along with smaller phrase technology. Any transcriptional  errors that result from this process are unintentional.

## 2016-12-31 ENCOUNTER — Encounter: Payer: Self-pay | Admitting: Internal Medicine

## 2016-12-31 DIAGNOSIS — K920 Hematemesis: Secondary | ICD-10-CM

## 2017-01-05 DIAGNOSIS — K920 Hematemesis: Secondary | ICD-10-CM | POA: Diagnosis not present

## 2017-01-11 DIAGNOSIS — R1013 Epigastric pain: Secondary | ICD-10-CM | POA: Diagnosis not present

## 2017-01-11 DIAGNOSIS — N183 Chronic kidney disease, stage 3 (moderate): Secondary | ICD-10-CM | POA: Diagnosis not present

## 2017-01-11 DIAGNOSIS — K920 Hematemesis: Secondary | ICD-10-CM | POA: Diagnosis not present

## 2017-01-11 DIAGNOSIS — K315 Obstruction of duodenum: Secondary | ICD-10-CM | POA: Diagnosis not present

## 2017-01-11 DIAGNOSIS — K259 Gastric ulcer, unspecified as acute or chronic, without hemorrhage or perforation: Secondary | ICD-10-CM | POA: Diagnosis not present

## 2017-01-11 DIAGNOSIS — K449 Diaphragmatic hernia without obstruction or gangrene: Secondary | ICD-10-CM | POA: Diagnosis not present

## 2017-01-11 DIAGNOSIS — K219 Gastro-esophageal reflux disease without esophagitis: Secondary | ICD-10-CM | POA: Diagnosis not present

## 2017-01-11 DIAGNOSIS — K269 Duodenal ulcer, unspecified as acute or chronic, without hemorrhage or perforation: Secondary | ICD-10-CM | POA: Diagnosis not present

## 2017-01-12 DIAGNOSIS — K219 Gastro-esophageal reflux disease without esophagitis: Secondary | ICD-10-CM | POA: Diagnosis present

## 2017-01-12 DIAGNOSIS — K269 Duodenal ulcer, unspecified as acute or chronic, without hemorrhage or perforation: Secondary | ICD-10-CM | POA: Diagnosis present

## 2017-01-12 DIAGNOSIS — K449 Diaphragmatic hernia without obstruction or gangrene: Secondary | ICD-10-CM | POA: Diagnosis present

## 2017-01-12 DIAGNOSIS — K5730 Diverticulosis of large intestine without perforation or abscess without bleeding: Secondary | ICD-10-CM | POA: Diagnosis present

## 2017-01-12 DIAGNOSIS — K259 Gastric ulcer, unspecified as acute or chronic, without hemorrhage or perforation: Secondary | ICD-10-CM | POA: Diagnosis present

## 2017-01-12 DIAGNOSIS — N183 Chronic kidney disease, stage 3 (moderate): Secondary | ICD-10-CM | POA: Diagnosis present

## 2017-01-12 DIAGNOSIS — K315 Obstruction of duodenum: Secondary | ICD-10-CM | POA: Diagnosis present

## 2017-01-12 DIAGNOSIS — Z91012 Allergy to eggs: Secondary | ICD-10-CM | POA: Diagnosis not present

## 2017-01-12 DIAGNOSIS — M810 Age-related osteoporosis without current pathological fracture: Secondary | ICD-10-CM | POA: Diagnosis present

## 2017-01-12 DIAGNOSIS — K920 Hematemesis: Secondary | ICD-10-CM | POA: Diagnosis present

## 2017-01-12 DIAGNOSIS — K2980 Duodenitis without bleeding: Secondary | ICD-10-CM | POA: Diagnosis not present

## 2017-01-12 DIAGNOSIS — M1990 Unspecified osteoarthritis, unspecified site: Secondary | ICD-10-CM | POA: Diagnosis present

## 2017-01-19 ENCOUNTER — Telehealth: Payer: Self-pay

## 2017-01-19 NOTE — Telephone Encounter (Signed)
Spoke to pt. She said she is doing better since her [facility] stay. She has lots of doctors appointments lined up and several GI procedures.

## 2017-01-25 DIAGNOSIS — K224 Dyskinesia of esophagus: Secondary | ICD-10-CM | POA: Diagnosis not present

## 2017-01-25 DIAGNOSIS — K219 Gastro-esophageal reflux disease without esophagitis: Secondary | ICD-10-CM | POA: Diagnosis not present

## 2017-01-25 DIAGNOSIS — K228 Other specified diseases of esophagus: Secondary | ICD-10-CM | POA: Diagnosis not present

## 2017-01-25 DIAGNOSIS — K449 Diaphragmatic hernia without obstruction or gangrene: Secondary | ICD-10-CM | POA: Diagnosis not present

## 2017-01-25 HISTORY — PX: ROBOTIC ASSISTED LAPAROSCOPIC REPAIR OF PARAESOPHAGEAL HERNIA: SHX6606

## 2017-01-31 DIAGNOSIS — H353221 Exudative age-related macular degeneration, left eye, with active choroidal neovascularization: Secondary | ICD-10-CM | POA: Diagnosis not present

## 2017-02-06 DIAGNOSIS — K449 Diaphragmatic hernia without obstruction or gangrene: Secondary | ICD-10-CM | POA: Diagnosis not present

## 2017-02-06 DIAGNOSIS — K920 Hematemesis: Secondary | ICD-10-CM | POA: Diagnosis not present

## 2017-02-07 DIAGNOSIS — H353231 Exudative age-related macular degeneration, bilateral, with active choroidal neovascularization: Secondary | ICD-10-CM | POA: Diagnosis not present

## 2017-02-17 DIAGNOSIS — M1990 Unspecified osteoarthritis, unspecified site: Secondary | ICD-10-CM | POA: Diagnosis not present

## 2017-02-17 DIAGNOSIS — R109 Unspecified abdominal pain: Secondary | ICD-10-CM | POA: Diagnosis not present

## 2017-02-17 DIAGNOSIS — K315 Obstruction of duodenum: Secondary | ICD-10-CM | POA: Diagnosis not present

## 2017-02-17 DIAGNOSIS — K279 Peptic ulcer, site unspecified, unspecified as acute or chronic, without hemorrhage or perforation: Secondary | ICD-10-CM | POA: Diagnosis not present

## 2017-02-17 DIAGNOSIS — K219 Gastro-esophageal reflux disease without esophagitis: Secondary | ICD-10-CM | POA: Diagnosis not present

## 2017-02-17 DIAGNOSIS — M810 Age-related osteoporosis without current pathological fracture: Secondary | ICD-10-CM | POA: Diagnosis not present

## 2017-02-17 DIAGNOSIS — R079 Chest pain, unspecified: Secondary | ICD-10-CM | POA: Diagnosis not present

## 2017-02-17 DIAGNOSIS — R6881 Early satiety: Secondary | ICD-10-CM | POA: Diagnosis not present

## 2017-02-17 DIAGNOSIS — J984 Other disorders of lung: Secondary | ICD-10-CM | POA: Diagnosis not present

## 2017-02-17 DIAGNOSIS — K449 Diaphragmatic hernia without obstruction or gangrene: Secondary | ICD-10-CM | POA: Diagnosis not present

## 2017-02-17 DIAGNOSIS — K920 Hematemesis: Secondary | ICD-10-CM | POA: Diagnosis not present

## 2017-02-17 DIAGNOSIS — Z91012 Allergy to eggs: Secondary | ICD-10-CM | POA: Diagnosis not present

## 2017-02-17 DIAGNOSIS — R1110 Vomiting, unspecified: Secondary | ICD-10-CM | POA: Diagnosis not present

## 2017-02-17 DIAGNOSIS — N183 Chronic kidney disease, stage 3 (moderate): Secondary | ICD-10-CM | POA: Diagnosis not present

## 2017-02-17 DIAGNOSIS — R1013 Epigastric pain: Secondary | ICD-10-CM | POA: Diagnosis not present

## 2017-02-18 DIAGNOSIS — K449 Diaphragmatic hernia without obstruction or gangrene: Secondary | ICD-10-CM | POA: Diagnosis not present

## 2017-02-18 DIAGNOSIS — K269 Duodenal ulcer, unspecified as acute or chronic, without hemorrhage or perforation: Secondary | ICD-10-CM | POA: Diagnosis not present

## 2017-02-18 DIAGNOSIS — R9431 Abnormal electrocardiogram [ECG] [EKG]: Secondary | ICD-10-CM | POA: Diagnosis not present

## 2017-02-18 DIAGNOSIS — J9811 Atelectasis: Secondary | ICD-10-CM | POA: Diagnosis not present

## 2017-02-18 DIAGNOSIS — N183 Chronic kidney disease, stage 3 (moderate): Secondary | ICD-10-CM | POA: Diagnosis not present

## 2017-02-18 DIAGNOSIS — R079 Chest pain, unspecified: Secondary | ICD-10-CM | POA: Diagnosis not present

## 2017-02-19 DIAGNOSIS — R918 Other nonspecific abnormal finding of lung field: Secondary | ICD-10-CM | POA: Diagnosis not present

## 2017-02-19 DIAGNOSIS — Z01810 Encounter for preprocedural cardiovascular examination: Secondary | ICD-10-CM | POA: Diagnosis not present

## 2017-02-19 DIAGNOSIS — I313 Pericardial effusion (noninflammatory): Secondary | ICD-10-CM | POA: Diagnosis not present

## 2017-02-19 DIAGNOSIS — I519 Heart disease, unspecified: Secondary | ICD-10-CM | POA: Diagnosis not present

## 2017-02-19 DIAGNOSIS — I517 Cardiomegaly: Secondary | ICD-10-CM | POA: Diagnosis not present

## 2017-02-19 DIAGNOSIS — K449 Diaphragmatic hernia without obstruction or gangrene: Secondary | ICD-10-CM | POA: Diagnosis not present

## 2017-02-19 MED ORDER — FAMOTIDINE 20 MG PO TABS
20.00 mg | ORAL_TABLET | ORAL | Status: DC
Start: ? — End: 2017-02-19

## 2017-02-19 MED ORDER — GENERIC EXTERNAL MEDICATION
Status: DC
Start: ? — End: 2017-02-19

## 2017-02-19 MED ORDER — TRAZODONE HCL 50 MG PO TABS
50.00 mg | ORAL_TABLET | ORAL | Status: DC
Start: ? — End: 2017-02-19

## 2017-02-19 MED ORDER — OMEPRAZOLE 20 MG PO CPDR
20.00 mg | DELAYED_RELEASE_CAPSULE | ORAL | Status: DC
Start: 2017-02-19 — End: 2017-02-19

## 2017-02-19 MED ORDER — ONDANSETRON 4 MG PO TBDP
4.00 mg | ORAL_TABLET | ORAL | Status: DC
Start: ? — End: 2017-02-19

## 2017-02-19 MED ORDER — OXYCODONE-ACETAMINOPHEN 5-325 MG PO TABS
1.00 | ORAL_TABLET | ORAL | Status: DC
Start: ? — End: 2017-02-19

## 2017-02-19 MED ORDER — GENERIC EXTERNAL MEDICATION
30.00 | Status: DC
Start: ? — End: 2017-02-19

## 2017-02-19 MED ORDER — ACETAMINOPHEN 325 MG PO TABS
650.00 mg | ORAL_TABLET | ORAL | Status: DC
Start: ? — End: 2017-02-19

## 2017-02-19 MED ORDER — POLYETHYLENE GLYCOL 3350 17 G PO PACK
17.00 | PACK | ORAL | Status: DC
Start: ? — End: 2017-02-19

## 2017-02-22 DIAGNOSIS — K219 Gastro-esophageal reflux disease without esophagitis: Secondary | ICD-10-CM | POA: Diagnosis present

## 2017-02-22 DIAGNOSIS — M810 Age-related osteoporosis without current pathological fracture: Secondary | ICD-10-CM | POA: Diagnosis present

## 2017-02-22 DIAGNOSIS — Z91012 Allergy to eggs: Secondary | ICD-10-CM | POA: Diagnosis not present

## 2017-02-22 DIAGNOSIS — R918 Other nonspecific abnormal finding of lung field: Secondary | ICD-10-CM | POA: Diagnosis not present

## 2017-02-22 DIAGNOSIS — R0902 Hypoxemia: Secondary | ICD-10-CM | POA: Diagnosis present

## 2017-02-22 DIAGNOSIS — K449 Diaphragmatic hernia without obstruction or gangrene: Secondary | ICD-10-CM | POA: Diagnosis present

## 2017-02-22 DIAGNOSIS — N189 Chronic kidney disease, unspecified: Secondary | ICD-10-CM | POA: Diagnosis present

## 2017-02-22 DIAGNOSIS — K269 Duodenal ulcer, unspecified as acute or chronic, without hemorrhage or perforation: Secondary | ICD-10-CM | POA: Diagnosis present

## 2017-02-22 DIAGNOSIS — M1990 Unspecified osteoarthritis, unspecified site: Secondary | ICD-10-CM | POA: Diagnosis present

## 2017-02-22 DIAGNOSIS — R1013 Epigastric pain: Secondary | ICD-10-CM | POA: Diagnosis not present

## 2017-02-25 MED ORDER — FAMOTIDINE 20 MG/2ML IV SOLN
20.00 mg | INTRAVENOUS | Status: DC
Start: 2017-02-25 — End: 2017-02-25

## 2017-02-25 MED ORDER — HYDRALAZINE HCL 20 MG/ML IJ SOLN
5.00 mg | INTRAMUSCULAR | Status: DC
Start: ? — End: 2017-02-25

## 2017-02-25 MED ORDER — GENERIC EXTERNAL MEDICATION
Status: DC
Start: ? — End: 2017-02-25

## 2017-02-25 MED ORDER — OXYCODONE HCL 5 MG/5ML PO SOLN
5.00 mg | ORAL | Status: DC
Start: ? — End: 2017-02-25

## 2017-02-25 MED ORDER — HEPARIN SODIUM (PORCINE) 5000 UNIT/ML IJ SOLN
5000.00 | INTRAMUSCULAR | Status: DC
Start: 2017-02-25 — End: 2017-02-25

## 2017-02-25 MED ORDER — ONDANSETRON HCL 4 MG/2ML IJ SOLN
4.00 mg | INTRAMUSCULAR | Status: DC
Start: ? — End: 2017-02-25

## 2017-02-25 MED ORDER — ACETAMINOPHEN 650 MG/20.3ML PO SOLN
650.00 mg | ORAL | Status: DC
Start: 2017-02-25 — End: 2017-02-25

## 2017-02-26 ENCOUNTER — Encounter: Payer: Self-pay | Admitting: Internal Medicine

## 2017-03-01 ENCOUNTER — Other Ambulatory Visit: Payer: Self-pay

## 2017-03-16 ENCOUNTER — Telehealth: Payer: Self-pay | Admitting: Internal Medicine

## 2017-03-16 DIAGNOSIS — K219 Gastro-esophageal reflux disease without esophagitis: Secondary | ICD-10-CM

## 2017-03-16 MED ORDER — FAMOTIDINE 40 MG/5ML PO SUSR: 40.0000 mg | Freq: Two times a day (BID) | ORAL | 0 refills | Status: DC

## 2017-03-16 NOTE — Telephone Encounter (Signed)
Spoke to [place].

## 2017-03-16 NOTE — Telephone Encounter (Signed)
Dr Silvio Pate out of office with no computer access.Please advise. Last seen 09/28/16 by Dr Silvio Pate.

## 2017-03-16 NOTE — Telephone Encounter (Signed)
This should be fine to continue.  Prescription sent to pharmacy.

## 2017-03-16 NOTE — Telephone Encounter (Signed)
Patient's daughter phoned with a medication management question. Sherry Phillips is 3 weeks post surgery-hernia repair and is doing well.  Dr. Narda Bonds, surgeon had placed her on pepcid, liquid form for 14 days post-op. Davy Pique, daughter would like for her to continue to take a some type of prophylaxis. Also, Mrs. Cotugno would prefer not to take it in pill form just yet. Pepcid dose was 40mg /80ml (8mg /ml) suspension. Take Two(2) times a day for 14 days. Copied from Dr. Gennaro Africa notes. [facility] pharmacy.  Please advise. She would like a text with decision, please. Please advise Sonya or send to [facility] to phone her back.

## 2017-03-25 ENCOUNTER — Encounter: Payer: Self-pay | Admitting: Internal Medicine

## 2017-03-26 DIAGNOSIS — H353231 Exudative age-related macular degeneration, bilateral, with active choroidal neovascularization: Secondary | ICD-10-CM | POA: Diagnosis not present

## 2017-03-26 NOTE — Telephone Encounter (Signed)
MyChart message sent back to the pt advising Dr Letvak was out of the office all week and there may be  delay in response. I asked if they wanted the message sent to a different provider. 

## 2017-04-04 DIAGNOSIS — H353231 Exudative age-related macular degeneration, bilateral, with active choroidal neovascularization: Secondary | ICD-10-CM | POA: Diagnosis not present

## 2017-04-11 ENCOUNTER — Encounter: Payer: Self-pay | Admitting: Internal Medicine

## 2017-04-30 DIAGNOSIS — H353231 Exudative age-related macular degeneration, bilateral, with active choroidal neovascularization: Secondary | ICD-10-CM | POA: Diagnosis not present

## 2017-05-11 ENCOUNTER — Ambulatory Visit: Payer: Self-pay

## 2017-05-11 NOTE — Telephone Encounter (Signed)
Patient's daughter Becky Sax called in and conference in with the patient with c/o "burning with urination." The patient says "probably 1 week ago, she started having burning with urination. Now I have the pressure, some pain with urinating. I've been drinking a lot of water." The daughter says "earlier in the week she was saying she was dribbling, so she's drinking a lot of water during the day and is up frequently during the night urinating." I asked the patient does she have a fever, she said "no." She says "Dr. Silvio Pate knows I won't call in unless it was something bad, so if he can call me in something to take without having to come in, that would be great." I advised that he may want to get a urine sample, so she would probably have to come in. I advised that the Saturday clinic is open at [facility] in [place], but they both declined unless Dr. Silvio Pate suggests it. Her daughter would like to be called with the decision to treat with or without an office visit to her cell [phone].     Reason for Disposition . Urinating more frequently than usual (i.e., frequency)  Answer Assessment - Initial Assessment Questions 1. SYMPTOM: "What's the main symptom you're concerned about?" (e.g., frequency, incontinence)     Frequency, burning 2. ONSET: "When did the  ________  start?"     Probably 1 week ago off and on 3. PAIN: "Is there any pain?" If so, ask: "How bad is it?" (Scale: 1-10; mild, moderate, severe)     Pressure, burning 4. CAUSE: "What do you think is causing the symptoms?"     Urinary tract infection 5. OTHER SYMPTOMS: "Do you have any other symptoms?" (e.g., fever, flank pain, blood in urine, pain with urination)     Fever, pain with urination, burning 6. PREGNANCY: "Is there any chance you are pregnant?" "When was your last menstrual period?"     No  Protocols used: URINARY [facility]

## 2017-05-11 NOTE — Telephone Encounter (Signed)
Spoke to pts daughter and advised OV required for abx. appt scheduled for 2/18 and daughter will contact office back if s/s subside over the weekend, or pt not wanting to be seen

## 2017-05-14 ENCOUNTER — Ambulatory Visit (INDEPENDENT_AMBULATORY_CARE_PROVIDER_SITE_OTHER): Payer: Medicare Other | Admitting: Internal Medicine

## 2017-05-14 ENCOUNTER — Encounter: Payer: Self-pay | Admitting: Internal Medicine

## 2017-05-14 VITALS — BP 130/86 | HR 82 | Temp 97.6°F | Wt 142.0 lb

## 2017-05-14 DIAGNOSIS — N3000 Acute cystitis without hematuria: Secondary | ICD-10-CM

## 2017-05-14 DIAGNOSIS — R300 Dysuria: Secondary | ICD-10-CM | POA: Diagnosis not present

## 2017-05-14 LAB — POC URINALSYSI DIPSTICK (AUTOMATED)
Bilirubin, UA: NEGATIVE
Blood, UA: NEGATIVE
GLUCOSE UA: NEGATIVE
Ketones, UA: NEGATIVE
NITRITE UA: NEGATIVE
Protein, UA: NEGATIVE
Urobilinogen, UA: 0.2 E.U./dL
pH, UA: 6 (ref 5.0–8.0)

## 2017-05-14 MED ORDER — SULFAMETHOXAZOLE-TRIMETHOPRIM 800-160 MG PO TABS: 1.0000 | ORAL_TABLET | Freq: Two times a day (BID) | ORAL | 1 refills | Status: DC

## 2017-05-14 NOTE — Patient Instructions (Signed)
Please call if you have ongoing urinary problems

## 2017-05-14 NOTE — Progress Notes (Signed)
Subjective:    Patient ID: Sherry Phillips, female    DOB: [birth year], 84 y.o.   MRN: [id number]  HPI Here for urinary problems Started a couple of weeks ago---suprapubic pressure and burning dysuria has tried drinking a lot of water-may have helped some Urgency at night also No fever No hematuria No N/V  Current Outpatient Medications on File Prior to Visit  Medication Sig Dispense Refill  . atorvastatin (LIPITOR) 40 MG tablet Take 1 tablet (40 mg total) by mouth daily. 30 tablet 11  . fexofenadine (ALLEGRA) 180 MG tablet Take 180 mg by mouth daily as needed.     . fluticasone (FLONASE) 50 MCG/ACT nasal spray Place 2 sprays into both nostrils daily. In each nostril 16 g 12  . Multiple Vitamins-Minerals (PRESERVISION AREDS 2) CAPS Take by mouth daily.    . ranitidine (ZANTAC) 150 MG tablet Take 150 mg by mouth at bedtime.      . vitamin B-12 (CYANOCOBALAMIN) 1000 MCG tablet Take 1,000 mcg by mouth daily.     No current facility-administered medications on file prior to visit.     Allergies  Allergen Reactions  . Egg Donia Pounds, Egg] Rash    Past Medical History:  Diagnosis Date  . GERD (gastroesophageal reflux disease)   . Hiatal hernia   . Hyperlipidemia   . Osteoarthritis     Past Surgical History:  Procedure Laterality Date  . APPENDECTOMY  1962   with ruptured ovary repair  . BREAST BIOPSY  1970  . CATARACT EXTRACTION     both eyes  . ROBOTIC ASSISTED LAPAROSCOPIC REPAIR OF PARAESOPHAGEAL HERNIA  01/2017   Dr Grandville Silos at [facility] History  Problem Relation Age of Onset  . Hypertension Mother   . Leukemia Mother   . COPD Father   . Diabetes Sister        borderline  . Coronary artery disease Brother   . Lung disease Brother   . Cancer Neg Hx        no breast or colon cancer    Social History   Socioeconomic History  . Marital status: Widowed    Spouse name: Not on file  . Number of children: 2  . Years of education: Not on file    . Highest education level: Not on file  Social Needs  . Financial resource strain: Not on file  . Food insecurity - worry: Not on file  . Food insecurity - inability: Not on file  . Transportation needs - medical: Not on file  . Transportation needs - non-medical: Not on file  Occupational History  . Occupation: homemaker  Tobacco Use  . Smoking status: Never Smoker  . Smokeless tobacco: Never Used  Substance and Sexual Activity  . Alcohol use: No  . Drug use: No  . Sexual activity: No  Other Topics Concern  . 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   One son and one daughter, [name]   Has living will.  Requests daughter to make decisions if needed.    Would accept CPR at this point, but no prolonged artificial ventilation.     Would not want tube feeds.   Review of Systems Did well after the hiatal hernia surgery Some "weak spells"--but generally doing okay Appetite is okay Same chronic back pain    Objective:   Physical Exam  Constitutional: She appears well-developed. No distress.  Abdominal: Soft. She  exhibits no distension. There is no tenderness. There is no rebound and no guarding.  Musculoskeletal:  No CVA tenderness          Assessment & Plan:

## 2017-05-14 NOTE — Assessment & Plan Note (Signed)
No systemic symptoms Urinalysis 2+ leuks No recent hospital stay and her first urinary infection Will treat with macrobid x 3 days Send culture only if recurs

## 2017-05-30 DIAGNOSIS — H353211 Exudative age-related macular degeneration, right eye, with active choroidal neovascularization: Secondary | ICD-10-CM | POA: Diagnosis not present

## 2017-06-11 ENCOUNTER — Encounter: Payer: Self-pay | Admitting: Internal Medicine

## 2017-06-11 DIAGNOSIS — H9193 Unspecified hearing loss, bilateral: Secondary | ICD-10-CM

## 2017-06-12 DIAGNOSIS — H353231 Exudative age-related macular degeneration, bilateral, with active choroidal neovascularization: Secondary | ICD-10-CM | POA: Diagnosis not present

## 2017-07-19 DIAGNOSIS — H903 Sensorineural hearing loss, bilateral: Secondary | ICD-10-CM | POA: Diagnosis not present

## 2017-07-31 DIAGNOSIS — H353231 Exudative age-related macular degeneration, bilateral, with active choroidal neovascularization: Secondary | ICD-10-CM | POA: Diagnosis not present

## 2017-08-06 DIAGNOSIS — H353211 Exudative age-related macular degeneration, right eye, with active choroidal neovascularization: Secondary | ICD-10-CM | POA: Diagnosis not present

## 2017-08-06 DIAGNOSIS — H903 Sensorineural hearing loss, bilateral: Secondary | ICD-10-CM | POA: Diagnosis not present

## 2017-09-05 DIAGNOSIS — H905 Unspecified sensorineural hearing loss: Secondary | ICD-10-CM | POA: Diagnosis not present

## 2017-09-05 DIAGNOSIS — H903 Sensorineural hearing loss, bilateral: Secondary | ICD-10-CM | POA: Diagnosis not present

## 2017-09-11 DIAGNOSIS — H353231 Exudative age-related macular degeneration, bilateral, with active choroidal neovascularization: Secondary | ICD-10-CM | POA: Diagnosis not present

## 2017-10-03 DIAGNOSIS — H43813 Vitreous degeneration, bilateral: Secondary | ICD-10-CM | POA: Diagnosis not present

## 2017-10-03 DIAGNOSIS — H353211 Exudative age-related macular degeneration, right eye, with active choroidal neovascularization: Secondary | ICD-10-CM | POA: Diagnosis not present

## 2017-10-18 DIAGNOSIS — H353231 Exudative age-related macular degeneration, bilateral, with active choroidal neovascularization: Secondary | ICD-10-CM | POA: Diagnosis not present

## 2017-10-19 DIAGNOSIS — H903 Sensorineural hearing loss, bilateral: Secondary | ICD-10-CM | POA: Diagnosis not present

## 2017-10-22 ENCOUNTER — Ambulatory Visit (INDEPENDENT_AMBULATORY_CARE_PROVIDER_SITE_OTHER): Payer: Medicare Other | Admitting: Family Medicine

## 2017-10-22 ENCOUNTER — Encounter: Payer: Self-pay | Admitting: Family Medicine

## 2017-10-22 VITALS — BP 136/80 | HR 72 | Temp 97.9°F | Ht 61.0 in | Wt 138.0 lb

## 2017-10-22 DIAGNOSIS — L255 Unspecified contact dermatitis due to plants, except food: Secondary | ICD-10-CM

## 2017-10-22 MED ORDER — TRIAMCINOLONE ACETONIDE 0.1 % EX OINT: 1.0000 "application " | TOPICAL_OINTMENT | Freq: Two times a day (BID) | CUTANEOUS | 1 refills | Status: DC

## 2017-10-22 NOTE — Progress Notes (Signed)
Dr. Frederico Hamman T. Lillieanna Tuohy, MD, [facility] [place], [address] Phone: [phone] Fax: [phone]  10/22/2017  Patient: Sherry Phillips, MRN: [id number], DOB: [birth year], 84 y.o.  Primary Physician:  [name], MD   Chief Complaint  Patient presents with  . rash on arms    started Friday   Subjective:   Sherry Phillips is a 84 y.o. very pleasant female patient who presents with the following:  Plant dermatitis. She was doing some yard work about 1 week ago, then broke out on her arms a few days later.  Arms and abdomen and will quit for a while. Over a week now.   Past Medical History, Surgical History, Social History, Family History, Problem List, Medications, and Allergies have been reviewed and updated if relevant.  Patient Active Problem List   Diagnosis Date Noted  . Acute cystitis without hematuria 05/14/2017  . Chest pain 12/28/2016  . Viral infection 04/26/2016  . Insect bite 10/14/2015  . Allergic rhinitis due to pollen 05/21/2015  . Vitamin B 12 deficiency 05/21/2015  . Fatigue 11/23/2014  . Contact dermatitis due to plant 11/05/2014  . Edema 10/31/2010  . Elevated blood-pressure reading without diagnosis of hypertension 10/31/2010  . HYPERLIPIDEMIA 04/13/2010  . GERD 04/13/2010  . OSTEOARTHRITIS 04/13/2010    Past Medical History:  Diagnosis Date  . GERD (gastroesophageal reflux disease)   . Hiatal hernia   . Hyperlipidemia   . Osteoarthritis     Past Surgical History:  Procedure Laterality Date  . APPENDECTOMY  1962   with ruptured ovary repair  . BREAST BIOPSY  1970  . CATARACT EXTRACTION     both eyes  . ROBOTIC ASSISTED LAPAROSCOPIC REPAIR OF PARAESOPHAGEAL HERNIA  01/2017   Dr Grandville Silos at [facility] History  . Marital status: Widowed    Spouse name: Not on file  . Number of children: 2  . Years of education: Not on file  .  Highest education level: Not on file  Occupational History  . Occupation: homemaker  Social Needs  . Financial resource strain: Not on file  . Food insecurity:    Worry: Not on file    Inability: Not on file  . Transportation needs:    Medical: Not on file    Non-medical: Not on file  Tobacco Use  . Smoking status: Never Smoker  . Smokeless tobacco: Never Used  Substance and Sexual Activity  . Alcohol use: No  . Drug use: No  . Sexual activity: Never  Lifestyle  . Physical activity:    Days per week: Not on file    Minutes per session: Not on file  . Stress: Not on file  Relationships  . Social connections:    Talks on phone: Not on file    Gets together: Not on file    Attends religious service: Not on file    Active member of club or organization: Not on file    Attends meetings of clubs or organizations: Not on file    Relationship status: Not on file  . Intimate partner violence:    Fear of current or ex partner: Not on file    Emotionally abused: Not on file    Physically abused: Not on file    Forced sexual activity: Not on file  Other Topics Concern  . 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   One son and one daughter, [name]   Has living will.  Requests daughter to make decisions if needed.    Would accept CPR at this point, but no prolonged artificial ventilation.     Would not want tube feeds.    Family History  Problem Relation Age of Onset  . Hypertension Mother   . Leukemia Mother   . COPD Father   . Diabetes Sister        borderline  . Coronary artery disease Brother   . Lung disease Brother   . Cancer Neg Hx        no breast or colon cancer    Allergies  Allergen Reactions  . Egg Donia Pounds, Egg] Rash    Medication list reviewed and updated in full in [facility].   GEN: No acute illnesses, no fevers, chills. GI: No n/v/d, eating normally Pulm: No SOB Interactive and getting along well  at home.  Otherwise, ROS is as per the HPI.  Objective:   BP 136/80   Pulse 72   Temp 97.9 F (36.6 C) (Oral)   Ht 5\' 1"  (1.549 m)   Wt 138 lb (62.6 kg)   BMI 26.07 kg/m   GEN: WDWN, NAD, Non-toxic, A & O x 3 HEENT: Atraumatic, Normocephalic. Neck supple. No masses, No LAD. Ears and Nose: No external deformity. EXTR: No c/c/e NEURO Normal gait.  PSYCH: Normally interactive. Conversant. Not depressed or anxious appearing.  Calm demeanor.   Scattered vesicles, some of which are in a linear pattern on her arms and to a lesser extent the left side of her abdomen.  Laboratory and Imaging Data:  Assessment and Plan:   Plant dermatitis  Appears to be obvious plant dermatitis  Follow-up: No follow-ups on file.  Meds ordered this encounter  Medications  . triamcinolone ointment (KENALOG) 0.1 %    Sig: Apply 1 application topically 2 (two) times daily.    Dispense:  80 g    Refill:  1   Signed,  Selisa Tensley T. Leslea Vowles, MD   Allergies as of 10/22/2017      Reactions   Egg Donia Pounds, Egg] Rash      Medication List        Accurate as of 10/22/17  1:39 PM. Always use your most recent med list.          atorvastatin 40 MG tablet Commonly known as:  LIPITOR Take 1 tablet (40 mg total) by mouth daily.   fexofenadine 180 MG tablet Commonly known as:  ALLEGRA Take 180 mg by mouth daily as needed.   fluticasone 50 MCG/ACT nasal spray Commonly known as:  FLONASE Place 2 sprays into both nostrils daily. In each nostril   PRESERVISION AREDS 2 Caps Take by mouth daily.   ranitidine 150 MG tablet Commonly known as:  ZANTAC Take 150 mg by mouth at bedtime.   triamcinolone ointment 0.1 % Commonly known as:  KENALOG Apply 1 application topically 2 (two) times daily.   vitamin B-12 1000 MCG tablet Commonly known as:  CYANOCOBALAMIN Take 1,000 mcg by mouth daily.   ZANFEL EX Apply topically as needed.

## 2017-11-28 DIAGNOSIS — H353211 Exudative age-related macular degeneration, right eye, with active choroidal neovascularization: Secondary | ICD-10-CM | POA: Diagnosis not present

## 2017-12-12 DIAGNOSIS — H353221 Exudative age-related macular degeneration, left eye, with active choroidal neovascularization: Secondary | ICD-10-CM | POA: Diagnosis not present

## 2018-01-17 DIAGNOSIS — H353211 Exudative age-related macular degeneration, right eye, with active choroidal neovascularization: Secondary | ICD-10-CM | POA: Diagnosis not present

## 2018-01-31 DIAGNOSIS — H353221 Exudative age-related macular degeneration, left eye, with active choroidal neovascularization: Secondary | ICD-10-CM | POA: Diagnosis not present

## 2018-03-07 DIAGNOSIS — H353211 Exudative age-related macular degeneration, right eye, with active choroidal neovascularization: Secondary | ICD-10-CM | POA: Diagnosis not present

## 2018-03-21 ENCOUNTER — Telehealth: Payer: Self-pay | Admitting: *Deleted

## 2018-03-21 MED ORDER — FAMOTIDINE 20 MG PO TABS: 20.0000 mg | ORAL_TABLET | Freq: Two times a day (BID) | ORAL | 3 refills | Status: DC

## 2018-03-21 NOTE — Telephone Encounter (Signed)
Send famotidine 20mg  bid (1 year)

## 2018-03-21 NOTE — Telephone Encounter (Signed)
Rx sent electronically. Spoke to pt.

## 2018-03-21 NOTE — Telephone Encounter (Signed)
Pt left message at triage. Pt said she can't get her ranitidine anywhere she looked I did advise her there is a recall on the med and that's why she can't find it anywhere. Pt said due to her past surgery she really needs to be on some medication for GERD so she is requesting Dr. Silvio Pate send in an alt med to her pharmacy [facility].   CB#[phone] (pt request call back to let her know what Dr. Silvio Pate suggest)

## 2018-03-26 DIAGNOSIS — H353221 Exudative age-related macular degeneration, left eye, with active choroidal neovascularization: Secondary | ICD-10-CM | POA: Diagnosis not present

## 2018-04-25 DIAGNOSIS — H353211 Exudative age-related macular degeneration, right eye, with active choroidal neovascularization: Secondary | ICD-10-CM | POA: Diagnosis not present

## 2018-05-15 DIAGNOSIS — H353221 Exudative age-related macular degeneration, left eye, with active choroidal neovascularization: Secondary | ICD-10-CM | POA: Diagnosis not present

## 2018-06-12 DIAGNOSIS — H353211 Exudative age-related macular degeneration, right eye, with active choroidal neovascularization: Secondary | ICD-10-CM | POA: Diagnosis not present

## 2018-06-28 DIAGNOSIS — H353221 Exudative age-related macular degeneration, left eye, with active choroidal neovascularization: Secondary | ICD-10-CM | POA: Diagnosis not present

## 2018-07-19 ENCOUNTER — Telehealth: Payer: Self-pay

## 2018-07-19 MED ORDER — NIZATIDINE 150 MG PO CAPS
150.0000 mg | ORAL_CAPSULE | Freq: Two times a day (BID) | ORAL | 3 refills | Status: DC
Start: 2018-07-19 — End: 2018-07-19

## 2018-07-19 MED ORDER — OMEPRAZOLE 20 MG PO CPDR: 20.0000 mg | DELAYED_RELEASE_CAPSULE | Freq: Every day | ORAL | 3 refills | Status: DC

## 2018-07-19 NOTE — Telephone Encounter (Signed)
Pharmacist at [facility] left v/m that pepcid 20 mg is on back order and pharmacist thinks they are going to take pepcid off market as done with ranitidine and request substitute med for pt to [facility].

## 2018-07-19 NOTE — Addendum Note (Signed)
Addended by: Helene Shoe on: 07/19/2018 02:55 PM   Modules accepted: Orders

## 2018-07-19 NOTE — Telephone Encounter (Signed)
Sent in rx for nizatidine 150 mg # 180 x 3 electronically to [facility]. I removed pepcid from med list.

## 2018-07-19 NOTE — Telephone Encounter (Signed)
I would try nizatadine 150mg  bid (1 year) If that is not available, will have to go with omeprazole 20mg  daily

## 2018-07-19 NOTE — Telephone Encounter (Addendum)
Maxine from [facility] left v/m that nizatidine is not longer manufactured. Will send in electronically omeprazole 20 mg cap taking one cap po daily. # 90 x 3. I spoke with pharmacist at [facility] to make sure had the omeprazole 20 mg cap in stock and he said yes they do have that in stock and will get ready for pt. I removed nizatidine from med list.

## 2018-08-01 DIAGNOSIS — H353211 Exudative age-related macular degeneration, right eye, with active choroidal neovascularization: Secondary | ICD-10-CM | POA: Diagnosis not present

## 2018-08-08 DIAGNOSIS — H353221 Exudative age-related macular degeneration, left eye, with active choroidal neovascularization: Secondary | ICD-10-CM | POA: Diagnosis not present

## 2018-09-18 DIAGNOSIS — H353221 Exudative age-related macular degeneration, left eye, with active choroidal neovascularization: Secondary | ICD-10-CM | POA: Diagnosis not present

## 2018-10-02 DIAGNOSIS — H353231 Exudative age-related macular degeneration, bilateral, with active choroidal neovascularization: Secondary | ICD-10-CM | POA: Diagnosis not present

## 2018-10-14 DIAGNOSIS — H903 Sensorineural hearing loss, bilateral: Secondary | ICD-10-CM | POA: Diagnosis not present

## 2018-10-14 DIAGNOSIS — Z974 Presence of external hearing-aid: Secondary | ICD-10-CM | POA: Diagnosis not present

## 2018-10-14 DIAGNOSIS — Z461 Encounter for fitting and adjustment of hearing aid: Secondary | ICD-10-CM | POA: Diagnosis not present

## 2018-10-24 ENCOUNTER — Other Ambulatory Visit: Payer: Self-pay

## 2018-11-13 DIAGNOSIS — H353221 Exudative age-related macular degeneration, left eye, with active choroidal neovascularization: Secondary | ICD-10-CM | POA: Diagnosis not present

## 2018-11-27 DIAGNOSIS — H353211 Exudative age-related macular degeneration, right eye, with active choroidal neovascularization: Secondary | ICD-10-CM | POA: Diagnosis not present

## 2018-12-20 ENCOUNTER — Encounter: Payer: Self-pay | Admitting: Family Medicine

## 2018-12-20 ENCOUNTER — Ambulatory Visit (INDEPENDENT_AMBULATORY_CARE_PROVIDER_SITE_OTHER): Payer: Medicare Other | Admitting: Family Medicine

## 2018-12-20 ENCOUNTER — Other Ambulatory Visit: Payer: Self-pay

## 2018-12-20 VITALS — BP 128/76 | HR 78 | Temp 98.0°F | Ht 61.0 in | Wt 139.4 lb

## 2018-12-20 DIAGNOSIS — L255 Unspecified contact dermatitis due to plants, except food: Secondary | ICD-10-CM | POA: Diagnosis not present

## 2018-12-20 MED ORDER — PREDNISONE 10 MG PO TABS: ORAL_TABLET | ORAL | 0 refills | Status: DC

## 2018-12-20 NOTE — Assessment & Plan Note (Signed)
Concern rash is spreading. Not improving with topical steroid. Will Rx low dose prednisone taper x 9 days. Discussed triamcinolone use for extremities, OTC cortisone-10 cream for face x 1-2 wks max. Update if not improving with this. Pt/daughter agree.

## 2018-12-20 NOTE — Patient Instructions (Addendum)
You do have poison oak dermatitis. Treat with oral prednisone taper - printed out today.  May use triamcinolone cream to arms and legs twice daily for max 2 weeks at a time.  May use cortisone-10 cream on face, no more than 1 week at a time.  Let us know if not improving with this.   Poison Oak Dermatitis  Poison oak dermatitis is inflammation of the skin that is caused by contact with the chemicals in the leaves of the poison oak (Toxicodendron) plant. The skin reaction often includes redness, swelling, blisters, and extreme itching. What are the causes? This condition is caused by a specific chemical (urushiol) that is found in the sap of the poison oak plant. This chemical is sticky and can be easily spread to people, animals, and objects. You can get poison oak dermatitis by:  Having direct contact with a poison oak plant.  Touching animals, other people, or objects that have come in contact with poison oak and have the chemical on them. What increases the risk? This condition is more likely to develop in people who:  Are outdoors often in wooded or [place] areas.  Go outdoors without wearing protective clothing, such as closed shoes, long pants, and a long-sleeved shirt. What are the signs or symptoms? Symptoms of this condition include:  Redness of the skin.  Extreme itching.  A rash that often includes bumps and blisters. The rash usually appears 48 hours after exposure if you have been exposed before. If this is the first time you have been exposed, the rash may not appear until a week after exposure.  Swelling. This may occur if the reaction is more severe. Symptoms usually last for 1-2 weeks. However, the first time you develop this condition, symptoms may last 3-4 weeks. How is this diagnosed? This condition may be diagnosed based on your symptoms and a physical exam. Your health care provider may also ask you about any recent outdoor activity. How is this  treated? Treatment for this condition will vary depending on how severe it is. Treatment may include:  Hydrocortisone creams or calamine lotions to relieve itching.  Oatmeal baths to soothe the skin.  Over-the-counter antihistamine medicines to help reduce itching.  Steroid medicine taken by mouth (orally) for more severe reactions. Follow these instructions at home: Medicines  Take or apply over-the-counter and prescription medicines only as told by your health care provider.  Use hydrocortisone creams or calamine lotion as needed to soothe the skin and relieve itching. General instructions  Do not scratch or rub your skin.  Apply a cold, wet cloth (cold compress) to the affected areas or take baths in cool water. This will help with itching. Avoid hot baths and showers.  Take oatmeal baths as needed. Use colloidal oatmeal. You can get this at your local pharmacy or grocery store. Follow the instructions on the packaging.  While you have the rash, wash clothes right after you wear them.  Keep all follow-up visits as told by your health care provider. This is important. How is this prevented?   Learn to identify the poison oak plant and avoid contact with the plant. This plant can be recognized by the number of leaves. Generally, poison oak has three leaves with flowering branches on a single stem. The leaves are often a bit fuzzy and have a toothlike edge.  If you have been exposed to poison oak, thoroughly wash your skin with soap and water right away. You have about 30 minutes to  remove the plant resin before it will cause the rash. Be sure to wash under your fingernails because any plant resin there will continue to spread the rash.  When hiking or camping, wear clothes that will help you avoid exposure on the skin. This includes long pants, a long-sleeved shirt, tall socks, and hiking boots. You can also apply preventive lotion to your skin to help limit exposure.  If you  suspect that your clothes or outdoor gear came in contact with poison oak, rinse them off outside with a garden hose before bringing them inside your house.  When doing yard work or gardening, wear gloves, long sleeves, long pants, and boots. Wash your garden tools and gloves if they come in contact with poison oak.  If you suspect that your pet has come into contact with poison oak, wash him or her with pet shampoo and water. Make sure you wear gloves while washing your pet.  Do not burn poison oak plants. This can release the chemical from the plant into the air and may cause a reaction on the skin or eyes, or in the lungs from breathing in the smoke. Contact a health care provider if you have:  Open sores in the rash area.  More redness, swelling, or pain in the affected area.  Redness that spreads beyond the rash area.  Fluid, blood, or pus coming from the affected area.  A fever.  A rash over a large area of your body.  A rash on your eyes, mouth, or genitals.  A rash that does not improve after a few weeks. Get help right away if:  Your face swells or your eyes swell shut.  You have trouble breathing.  You have trouble swallowing. These symptoms may represent a serious problem that is an emergency. Do not wait to see if the symptoms will go away. Get medical help right away. Call your local emergency services (911 in the [place] Do not drive yourself to the hospital. Summary  Poison oak dermatitis is inflammation of the skin that is caused by contact with the chemicals on the leaves of the poison oak (Toxicodendron) plant.  Symptoms of this condition include redness, extreme itching, a rash, and swelling.  Do not scratch or rub your skin.  Take or apply over-the-counter and prescription medicines only as told by your health care provider. This information is not intended to replace advice given to you by your health care provider. Make sure you discuss any questions you  have with your health care provider. Document Released: 09/17/2002 Document Revised: 07/05/2018 Document Reviewed: 04/12/2018 Elsevier Patient Education  2020 [employer].

## 2018-12-20 NOTE — Progress Notes (Signed)
This visit was conducted in person.  BP 128/76   Pulse 78   Temp 98 F (36.7 C)   Ht 5\' 1"  (1.549 m)   Wt 139 lb 6 oz (63.2 kg)   SpO2 100%   BMI 26.33 kg/m    CC: rash "I've got poison oak on my face" Subjective:    Patient ID: Sherry Phillips, female    DOB: 27-Jun-1933, 85 y.o.   MRN: [id number]  HPI: Sherry Phillips is a 85 y.o. female presenting on 12/20/2018 for Rash ( started 3 days ago, red rash area left eyebrow, right cheeck, right neck area, behind left ear and behind left knee. Hx poison ivy.)   3d h/o rash to face, eyebrows L>R, into neck, spots R hand fingers and on R wrist. One isolated spot on leg behind L knee. Rash spares body.   She has been working outdoors and thinks was exposed to poison oak.   She did well with steroid shot in the past.  Treating with calomine and triamcinolone without benefit.   Upcoming trip to the beach next week.       Relevant past medical, surgical, family and social history reviewed and updated as indicated. Interim medical history since our last visit reviewed. Allergies and medications reviewed and updated. Outpatient Medications Prior to Visit  Medication Sig Dispense Refill  . fexofenadine (ALLEGRA) 180 MG tablet Take 180 mg by mouth daily as needed.     . fluticasone (FLONASE) 50 MCG/ACT nasal spray Place 2 sprays into both nostrils daily. In each nostril (Patient taking differently: Place 2 sprays into both nostrils daily as needed. In each nostril) 16 g 12  . Multiple Vitamins-Minerals (PRESERVISION AREDS 2) CAPS Take by mouth daily.    Marland Kitchen omeprazole (PRILOSEC) 20 MG capsule Take 1 capsule (20 mg total) by mouth daily. 90 capsule 3  . triamcinolone ointment (KENALOG) 0.1 % Apply 1 application topically 2 (two) times daily. 80 g 1  . vitamin B-12 (CYANOCOBALAMIN) 1000 MCG tablet Take 1,000 mcg by mouth daily.    . Poison Ivy Treatments (ZANFEL EX) Apply topically as needed.    Marland Kitchen atorvastatin (LIPITOR) 40 MG  tablet Take 1 tablet (40 mg total) by mouth daily. 30 tablet 11   No facility-administered medications prior to visit.      Per HPI unless specifically indicated in ROS section below Review of Systems Objective:    BP 128/76   Pulse 78   Temp 98 F (36.7 C)   Ht 5\' 1"  (1.549 m)   Wt 139 lb 6 oz (63.2 kg)   SpO2 100%   BMI 26.33 kg/m   Wt Readings from Last 3 Encounters:  12/20/18 139 lb 6 oz (63.2 kg)  10/22/17 138 lb (62.6 kg)  05/14/17 142 lb (64.4 kg)    Physical Exam Vitals signs and nursing note reviewed.  Constitutional:      General: She is not in acute distress.    Appearance: Normal appearance. She is not ill-appearing.  Skin:    Findings: Erythema and rash present.     Comments:  Pruritic erythematous rash L eyebrow, few spots on R eyebrow and R cheek near lip, into R neck Pruritic blisters R ulnar wrist and interdigital space between 4th/5th digits Pruritic blister R popliteal reagion  Neurological:     Mental Status: She is alert.       Assessment & Plan:   Problem List Items Addressed This Visit    Contact  dermatitis due to plant - Primary    Concern rash is spreading. Not improving with topical steroid. Will Rx low dose prednisone taper x 9 days. Discussed triamcinolone use for extremities, OTC cortisone-10 cream for face x 1-2 wks max. Update if not improving with this. Pt/daughter agree.           Meds ordered this encounter  Medications  . DISCONTD: predniSONE (DELTASONE) 10 MG tablet    Sig: Take 4 tablets (40 mg total) by mouth daily with breakfast for 2 days, THEN 3 tablets (30 mg total) daily with breakfast for 2 days, THEN 2 tablets (20 mg total) daily with breakfast for 2 days, THEN 1 tablet (10 mg total) daily with breakfast for 4 days. Take three tablets for 3 days followed by two tablets for 3 days followed by one tablet for 3 days.    Dispense:  22 tablet    Refill:  0  . predniSONE (DELTASONE) 10 MG tablet    Sig: Take three tablets  for 3 days followed by two tablets for 3 days followed by one tablet for 3 days    Dispense:  18 tablet    Refill:  0   No orders of the defined types were placed in this encounter.   Follow up plan: No follow-ups on file.  Ria Bush, MD

## 2019-01-01 DIAGNOSIS — H353221 Exudative age-related macular degeneration, left eye, with active choroidal neovascularization: Secondary | ICD-10-CM | POA: Diagnosis not present

## 2019-01-14 ENCOUNTER — Telehealth: Payer: Self-pay | Admitting: Internal Medicine

## 2019-01-14 NOTE — Telephone Encounter (Signed)
Spoke to pt. She will try 2 omeprazole daily. She will try to schedule an AWV soon. Said her son is the only one who drives and he works a lot. Hard for her to get in the office.

## 2019-01-14 NOTE — Telephone Encounter (Signed)
Pt said she is having pain where her ulcer was. Zantac kept the pain away for years. She is asking if she needs to take more of the omeprazole or change it all together.

## 2019-01-14 NOTE — Telephone Encounter (Signed)
Okay to double the omeprazole---take 20mg  twice a day. May want to discuss with the surgeon who did her esophagus repair as well Hasn't been in for some time with me--probably need to set up a routine AMW

## 2019-01-14 NOTE — Telephone Encounter (Signed)
Left message to call office

## 2019-01-14 NOTE — Telephone Encounter (Signed)
Pt called stating she didn't think her rx prilosec is  working.  She wanted to know if dr Silvio Pate would call something else in  [facility]

## 2019-01-22 DIAGNOSIS — H353211 Exudative age-related macular degeneration, right eye, with active choroidal neovascularization: Secondary | ICD-10-CM | POA: Diagnosis not present

## 2019-02-19 ENCOUNTER — Other Ambulatory Visit: Payer: Self-pay

## 2019-02-25 DIAGNOSIS — Z20828 Contact with and (suspected) exposure to other viral communicable diseases: Secondary | ICD-10-CM | POA: Diagnosis not present

## 2019-03-19 ENCOUNTER — Encounter: Payer: Medicare Other | Admitting: Internal Medicine

## 2019-05-01 ENCOUNTER — Telehealth: Payer: Self-pay | Admitting: Internal Medicine

## 2019-05-01 NOTE — Telephone Encounter (Signed)
Patient called today She stated that she has not had blood work done in quite sometime and she would like to schedule an appointment to come in for them. Patient stated she just wanted to make sure that everything is okay and her levels are good.   Please advise

## 2019-05-01 NOTE — Telephone Encounter (Signed)
okay

## 2019-05-01 NOTE — Telephone Encounter (Signed)
Patient had an appointment for wellness visit on 05/19/19.  Patient said she can't keep that appointment because her son has been off work since Summer and he just went back to work and he can't get the time off.  Patient did say she would come in for a follow up visit on 05/12/19 at 4:00.  She said she'd check with her son to make sure he can bring her.

## 2019-05-01 NOTE — Telephone Encounter (Signed)
She doesn't take medications that require routine monitoring so hard to tell what to check. I would recommend at least an on line visit (preferably in office) to review how she is and decide what blood work would be appropriate

## 2019-05-07 ENCOUNTER — Ambulatory Visit (INDEPENDENT_AMBULATORY_CARE_PROVIDER_SITE_OTHER): Payer: Medicare Other | Admitting: Internal Medicine

## 2019-05-07 ENCOUNTER — Encounter: Payer: Self-pay | Admitting: Internal Medicine

## 2019-05-07 ENCOUNTER — Other Ambulatory Visit: Payer: Self-pay

## 2019-05-07 VITALS — BP 140/84 | HR 80 | Temp 97.7°F | Ht 61.0 in | Wt 136.0 lb

## 2019-05-07 DIAGNOSIS — J301 Allergic rhinitis due to pollen: Secondary | ICD-10-CM

## 2019-05-07 DIAGNOSIS — H353230 Exudative age-related macular degeneration, bilateral, stage unspecified: Secondary | ICD-10-CM

## 2019-05-07 DIAGNOSIS — K219 Gastro-esophageal reflux disease without esophagitis: Secondary | ICD-10-CM | POA: Diagnosis not present

## 2019-05-07 DIAGNOSIS — Z0000 Encounter for general adult medical examination without abnormal findings: Secondary | ICD-10-CM | POA: Diagnosis not present

## 2019-05-07 DIAGNOSIS — H353290 Exudative age-related macular degeneration, unspecified eye, stage unspecified: Secondary | ICD-10-CM | POA: Insufficient documentation

## 2019-05-07 DIAGNOSIS — E538 Deficiency of other specified B group vitamins: Secondary | ICD-10-CM | POA: Diagnosis not present

## 2019-05-07 DIAGNOSIS — Z7189 Other specified counseling: Secondary | ICD-10-CM | POA: Diagnosis not present

## 2019-05-07 LAB — COMPREHENSIVE METABOLIC PANEL
ALT: 10 U/L (ref 0–35)
AST: 15 U/L (ref 0–37)
Albumin: 4 g/dL (ref 3.5–5.2)
Alkaline Phosphatase: 97 U/L (ref 39–117)
BUN: 16 mg/dL (ref 6–23)
CO2: 30 mEq/L (ref 19–32)
Calcium: 9.4 mg/dL (ref 8.4–10.5)
Chloride: 104 mEq/L (ref 96–112)
Creatinine, Ser: 1.25 mg/dL — ABNORMAL HIGH (ref 0.40–1.20)
GFR: 40.85 mL/min — ABNORMAL LOW (ref >60.00)
Glucose, Bld: 91 mg/dL (ref 70–99)
Potassium: 3.9 mEq/L (ref 3.5–5.1)
Sodium: 141 mEq/L (ref 135–145)
Total Bilirubin: 0.8 mg/dL (ref 0.2–1.2)
Total Protein: 7.1 g/dL (ref 6.0–8.3)

## 2019-05-07 LAB — CBC
HCT: 45 % (ref 36.0–46.0)
Hemoglobin: 14.8 g/dL (ref 12.0–15.0)
MCHC: 32.8 g/dL (ref 30.0–36.0)
MCV: 90.2 fl (ref 78.0–100.0)
Platelets: 279 10*3/uL (ref 150.0–400.0)
RBC: 4.99 Mil/uL (ref 3.87–5.11)
RDW: 15 % (ref 11.5–15.5)
WBC: 8.4 10*3/uL (ref 4.0–10.5)

## 2019-05-07 LAB — VITAMIN B12: Vitamin B-12: 91 pg/mL — ABNORMAL LOW (ref 211–911)

## 2019-05-07 NOTE — Assessment & Plan Note (Signed)
Getting injections regularly Vision remains okay

## 2019-05-07 NOTE — Progress Notes (Signed)
Subjective:    Patient ID: Sherry Phillips, female    DOB: Oct 16, 1933, 86 y.o.   MRN: [id number]  HPI Here for Medicare wellness visit and follow up of chronic health conditions This visit occurred during the SARS-CoV-2 public health emergency.  Safety protocols were in place, including screening questions prior to the visit, additional usage of staff PPE, and extensive cleaning of exam room while observing appropriate contact time as indicated for disinfecting solutions.   Reviewed advanced directives Reviewed other doctors---Dr King--ophtha, Dr Neely---dentist No alcohol or tobacco Vision is okay Lost hearing on right, sig loss on left---has hearing aide No falls No depression or anhedonia---some life stress though (nephew very sick in [facility] with cancer/COVID) Independent with instrumental ADLs No problems with memory Not much exercise--walks in house and some leg lifts No hospitalizations or surgery in the past year  Had COVID along with daughter/SIL/son Not severe----and doing fine now  Takes prilosec for GERD This controls heartburn--did need to take bid for a few days No dysphagia  Uses OTC allergy meds prn Mostly allegra  No longer taking B12 No sensory changes Energy levels are fine  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 fexofenadine (ALLEGRA) 180 MG tablet Take 180 mg by mouth daily as needed.     . Multiple Vitamins-Minerals (PRESERVISION AREDS 2) CAPS Take by mouth daily.    Marland Kitchen omeprazole (PRILOSEC) 20 MG capsule Take 1 capsule (20 mg total) by mouth daily. 90 capsule 3  . Poison Ivy Treatments (ZANFEL EX) Apply topically as needed.     No current facility-administered medications on file prior to visit.    Allergies  Allergen Reactions  . Egg Donia Pounds, Egg] Rash    Past Medical History:  Diagnosis Date  . GERD  (gastroesophageal reflux disease)   . Hiatal hernia   . Hyperlipidemia   . Osteoarthritis     Past Surgical History:  Procedure Laterality Date  . APPENDECTOMY  1962   with ruptured ovary repair  . BREAST BIOPSY  1970  . CATARACT EXTRACTION     both eyes  . ROBOTIC ASSISTED LAPAROSCOPIC REPAIR OF PARAESOPHAGEAL HERNIA  01/2017   Dr Grandville Silos at [facility] History  Problem Relation Age of Onset  . Hypertension Mother   . Leukemia Mother   . COPD Father   . Diabetes Sister        borderline  . Coronary artery disease Brother   . Lung disease Brother   . Cancer Neg Hx        no breast or colon cancer    Social History   Socioeconomic History  . Marital status: Widowed    Spouse name: Not on file  . Number of children: 2  . Years of education: Not on file  . Highest education level: Not on file  Occupational History  . Occupation: homemaker  Tobacco Use  . Smoking status: Never Smoker  . Smokeless tobacco: Never Used  Substance and Sexual Activity  . Alcohol use: No  . Drug use: No  . Sexual activity: Never  Other Topics Concern  . Not on file  Social History Narrative   Widowed 2015      One son and one daughter, [name]   Has living will.  Requests daughter to make decisions if needed.    Would accept CPR at this point,  but no prolonged artificial ventilation.     Would not want tube feeds.   Social Determinants of Health   Financial Resource Strain:   . Difficulty of Paying Living Expenses: Not on file  Food Insecurity:   . Worried About Charity fundraiser in the Last Year: Not on file  . Ran Out of Food in the Last Year: Not on file  Transportation Needs:   . Lack of Transportation (Medical): Not on file  . Lack of Transportation (Non-Medical): Not on file  Physical Activity:   . Days of Exercise per Week: Not on file  . Minutes of Exercise per Session: Not on file  Stress:   . Feeling of Stress : Not on file  Social Connections:   .  Frequency of Communication with Friends and Family: Not on file  . Frequency of Social Gatherings with Friends and Family: Not on file  . Attends Religious Services: Not on file  . Active Member of Clubs or Organizations: Not on file  . Attends Archivist Meetings: Not on file  . Marital Status: Not on file  Intimate Partner Violence:   . Fear of Current or Ex-Partner: Not on file  . Emotionally Abused: Not on file  . Physically Abused: Not on file  . Sexually Abused: Not on file   Review of Systems Appetite is good Weight is about the same Sleeps fine Wears seat belt Teeth have been okay No chest pain or SOB No dizziness or syncope Bowels move fine--no blood Voids fine---no dysuria or incontinence No edema No sig back or joint pains--occ knee aching (uses OTC rub with success)    Objective:   Physical Exam  Constitutional: She is oriented to person, place, and time. She appears well-developed. No distress.  HENT:  Mouth/Throat: Oropharynx is clear and moist. No oropharyngeal exudate.  Full upper  Neck: No thyromegaly present.  Cardiovascular: Normal rate, regular rhythm, normal heart sounds and intact distal pulses. Exam reveals no gallop.  No murmur heard. Respiratory: Effort normal and breath sounds normal. No respiratory distress. She has no wheezes. She has no rales.  GI: Soft. There is no abdominal tenderness.  Musculoskeletal:        General: No tenderness or edema.  Lymphadenopathy:    She has no cervical adenopathy.  Neurological: She is alert and oriented to person, place, and time.  President--- "Wendie Simmer----- Clinton" 100-? [id number]--I left the R out Recall--- 3/3  Skin: No rash noted. No erythema.  Psychiatric: She has a normal mood and affect. Her behavior is normal.           Assessment & Plan:

## 2019-05-07 NOTE — Assessment & Plan Note (Signed)
I have personally reviewed the Medicare Annual Wellness questionnaire and have noted 1. The patient's medical and social history 2. Their use of alcohol, tobacco or illicit drugs 3. Their current medications and supplements 4. The patient's functional ability including ADL's, fall risks, home safety risks and hearing or visual             impairment. 5. Diet and physical activities 6. Evidence for depression or mood disorders  The patients weight, height, BMI and visual acuity have been recorded in the chart I have made referrals, counseling and provided education to the patient based review of the above and I have provided the pt with a written personalized care plan for preventive services.  I have provided you with a copy of your personalized plan for preventive services. Please take the time to review along with your updated medication list.  No cancer screening due to age Prefers no vaccines Discussed exercise

## 2019-05-07 NOTE — Assessment & Plan Note (Signed)
Has not been taking supplements No fatigue or sensory change--but some memory issues Will recheck levels

## 2019-05-07 NOTE — Assessment & Plan Note (Signed)
Uses allegra prn.  

## 2019-05-07 NOTE — Progress Notes (Signed)
Hearing Screening   125Hz  250Hz  500Hz  1000Hz  2000Hz  3000Hz  4000Hz  6000Hz  8000Hz   Right ear:           Left ear:           Comments: Has hearing aid in left ear. Deaf in right ear.  Vision Screening Comments: January 2021

## 2019-05-07 NOTE — Assessment & Plan Note (Signed)
See social history 

## 2019-05-07 NOTE — Assessment & Plan Note (Signed)
Does okay with daily PPI 

## 2019-05-08 ENCOUNTER — Other Ambulatory Visit: Payer: Self-pay | Admitting: Internal Medicine

## 2019-05-08 DIAGNOSIS — E538 Deficiency of other specified B group vitamins: Secondary | ICD-10-CM

## 2019-05-12 ENCOUNTER — Ambulatory Visit: Payer: Medicare Other | Admitting: Internal Medicine

## 2019-05-19 ENCOUNTER — Ambulatory Visit: Payer: Medicare Other | Admitting: Internal Medicine

## 2019-06-05 DIAGNOSIS — H353211 Exudative age-related macular degeneration, right eye, with active choroidal neovascularization: Secondary | ICD-10-CM | POA: Diagnosis not present

## 2019-06-10 ENCOUNTER — Other Ambulatory Visit: Payer: Self-pay

## 2019-06-10 ENCOUNTER — Other Ambulatory Visit (INDEPENDENT_AMBULATORY_CARE_PROVIDER_SITE_OTHER): Payer: Medicare Other

## 2019-06-10 DIAGNOSIS — E538 Deficiency of other specified B group vitamins: Secondary | ICD-10-CM | POA: Diagnosis not present

## 2019-06-10 LAB — VITAMIN B12: Vitamin B-12: >1500 pg/mL — ABNORMAL HIGH (ref 211–911)

## 2019-06-12 DIAGNOSIS — H353221 Exudative age-related macular degeneration, left eye, with active choroidal neovascularization: Secondary | ICD-10-CM | POA: Diagnosis not present

## 2019-07-09 ENCOUNTER — Other Ambulatory Visit: Payer: Self-pay

## 2019-07-09 ENCOUNTER — Encounter: Payer: Self-pay | Admitting: Internal Medicine

## 2019-07-09 ENCOUNTER — Ambulatory Visit (INDEPENDENT_AMBULATORY_CARE_PROVIDER_SITE_OTHER): Payer: Medicare Other | Admitting: Internal Medicine

## 2019-07-09 DIAGNOSIS — R5383 Other fatigue: Secondary | ICD-10-CM

## 2019-07-09 NOTE — Progress Notes (Signed)
Subjective:    Patient ID: Sherry Phillips, female    DOB: 1933-07-07, 86 y.o.   MRN: [id number]  HPI Here due to feeling weak This visit occurred during the SARS-CoV-2 public health emergency.  Safety protocols were in place, including screening questions prior to the visit, additional usage of staff PPE, and extensive cleaning of exam room while observing appropriate contact time as indicated for disinfecting solutions.   Started about 10 days ago No energy upon arising and then goes on all day Has been just sitting around Yesterday---"I felt fine" and she went to the beach for her birthday  Does like being home--but hasn't been outside much Hasn't started with her flowers like she normally would  Has been cleaning up around church--beds outside, etc Has been able to do that (this was earlier--before the fatigue time)  Current Outpatient Medications on File Prior to Visit  Medication Sig Dispense Refill  . fexofenadine (ALLEGRA) 180 MG tablet Take 180 mg by mouth daily as needed.     . Multiple Vitamins-Minerals (PRESERVISION AREDS 2) CAPS Take by mouth daily.    Marland Kitchen omeprazole (PRILOSEC) 20 MG capsule Take 1 capsule (20 mg total) by mouth daily. 90 capsule 3  . Poison Ivy Treatments (ZANFEL EX) Apply topically as needed.    . vitamin B-12 (CYANOCOBALAMIN) 1000 MCG tablet Take 1,000 mcg by mouth every other day.     No current facility-administered medications on file prior to visit.    Allergies  Allergen Reactions  . Egg Donia Pounds, Egg] Rash    Past Medical History:  Diagnosis Date  . GERD (gastroesophageal reflux disease)   . Hiatal hernia   . Hyperlipidemia   . Osteoarthritis     Past Surgical History:  Procedure Laterality Date  . APPENDECTOMY  1962   with ruptured ovary repair  . BREAST BIOPSY  1970  . CATARACT EXTRACTION     both eyes  . ROBOTIC ASSISTED LAPAROSCOPIC REPAIR OF PARAESOPHAGEAL HERNIA  01/2017   Dr Grandville Silos at [facility]  History  Problem Relation Age of Onset  . Hypertension Mother   . Leukemia Mother   . COPD Father   . Diabetes Sister        borderline  . Coronary artery disease Brother   . Lung disease Brother   . Cancer Neg Hx        no breast or colon cancer    Social History   Socioeconomic History  . Marital status: Widowed    Spouse name: Not on file  . Number of children: 2  . Years of education: Not on file  . Highest education level: Not on file  Occupational History  . Occupation: homemaker  Tobacco Use  . Smoking status: Never Smoker  . Smokeless tobacco: Never Used  Substance and Sexual Activity  . Alcohol use: No  . Drug use: No  . Sexual activity: Never  Other Topics Concern  . Not on file  Social History Narrative   Widowed 2015      One son and one daughter, [name]   Has living will.  Requests daughter to make decisions if needed.    Would accept CPR at this point, but no prolonged artificial ventilation.     Would not want tube feeds.   Social Determinants of Health   Financial Resource Strain:   . Difficulty of Paying Living Expenses:   Food Insecurity:   . Worried About [employer] of  Food in the Last Year:   . [place] in the Last Year:   Transportation Needs:   . Film/video editor (Medical):   Marland Kitchen Lack of Transportation (Non-Medical):   Physical Activity:   . Days of Exercise per Week:   . Minutes of Exercise per Session:   Stress:   . Feeling of Stress :   Social Connections:   . Frequency of Communication with Friends and Family:   . Frequency of Social Gatherings with Friends and Family:   . Attends Religious Services:   . Active Member of Clubs or Organizations:   . Attends Archivist Meetings:   Marland Kitchen Marital Status:   Intimate Partner Violence:   . Fear of Current or Ex-Partner:   . Emotionally Abused:   Marland Kitchen Physically Abused:   . Sexually Abused:    Review of Systems Sleeping okay No pains Appetite is fine       Objective:   Physical Exam  Constitutional: She appears well-developed. No distress.  Neck: No thyromegaly present.  Cardiovascular: Normal rate, regular rhythm, normal heart sounds and intact distal pulses. Exam reveals no gallop.  No murmur heard. Respiratory: Effort normal and breath sounds normal. No respiratory distress. She has no wheezes. She has no rales.  GI: Soft. There is no abdominal tenderness.  Musculoskeletal:        General: No edema.  Lymphadenopathy:       Head (right side): No submental, no submandibular and no occipital adenopathy present.       Head (left side): No submental, no submandibular and no occipital adenopathy present.    She has no cervical adenopathy.    She has no axillary adenopathy.  Psychiatric: She has a normal mood and affect. Her behavior is normal.           Assessment & Plan:

## 2019-07-09 NOTE — Assessment & Plan Note (Signed)
Lots of stress Son in law having surgery today, stuck inside to some degree (though already had COVID) Sleeping okay, not really depressed, no physical symptoms Felt better going to the beach yesterday Blood work reassuring 2 months ago Will have her get back to he flowers, etc--see how she does

## 2019-08-08 ENCOUNTER — Telehealth: Payer: Self-pay

## 2019-08-08 NOTE — Telephone Encounter (Signed)
Error

## 2019-08-11 ENCOUNTER — Ambulatory Visit (INDEPENDENT_AMBULATORY_CARE_PROVIDER_SITE_OTHER): Payer: Medicare Other | Admitting: Family Medicine

## 2019-08-11 ENCOUNTER — Other Ambulatory Visit: Payer: Self-pay

## 2019-08-11 ENCOUNTER — Encounter: Payer: Self-pay | Admitting: Family Medicine

## 2019-08-11 VITALS — BP 130/70 | HR 91 | Temp 98.2°F | Ht 61.0 in | Wt 136.8 lb

## 2019-08-11 DIAGNOSIS — R0781 Pleurodynia: Secondary | ICD-10-CM | POA: Diagnosis not present

## 2019-08-11 NOTE — Progress Notes (Signed)
    Sherry Paino T. Sherry Pagaduan, MD, [place] at [facility] [place], [address]  Phone: [phone]  FAX: [place] - 86 y.o. female  MRN [id number]  Date of Birth: [birth year]  Date: 08/11/2019  PCP: [name], MD  Referral: [name], MD  Chief Complaint  Patient presents with  . Fall    Last Monday  . Right Rib Pain    This visit occurred during the SARS-CoV-2 public health emergency.  Safety protocols were in place, including screening questions prior to the visit, additional usage of staff PPE, and extensive cleaning of exam room while observing appropriate contact time as indicated for disinfecting solutions.   Subjective:   Sherry Phillips is a 86 y.o. very pleasant female patient with Body mass index is 25.84 kg/m. who presents with the following:  She fell 1 week ago, and she has some pain and bruising on the right side of her thorax in the region of the bottom ribs.  She is not in any pain in her abdominal region.  At this point her symptoms have started to get better, but she and her family wanted to be checked in the office.  [place] last weak and fell on top of her flower pt and hit her side.   Was black and blue.  Has not slept in her bed since Sunday.    1 week ago.    Review of Systems is noted in the HPI, as appropriate   Objective:   BP 130/70   Pulse 91   Temp 98.2 F (36.8 C) (Temporal)   Ht 5\' 1"  (1.549 m)   Wt 136 lb 12 oz (62 kg)   SpO2 96%   BMI 25.84 kg/m   GEN: No acute distress; alert,appropriate. PULM: Breathing comfortably in no respiratory distress PSYCH: Normally interactive.    No tenderness with compression at the sternum, or tenderness at the left side of her rib cage.  Nontender in the upper middle rib cage on the right with some bruising and modest tenderness in the anterior and lateral  ribs.  ABD: S, NT, ND, + BS, No rebound, No HSM   Radiology: No results found.  Assessment and Plan:     ICD-10-CM   1. Rib pain on right side  R07.81    Bruised ribs with some soft tissue injury as well.  Offered some reassurance.  With this level of improvement after 1 week there is basically no risk of her having had a rib fracture, and I explained all of this to her.  Follow-up: No follow-ups on file.  No orders of the defined types were placed in this encounter.  There are no discontinued medications. No orders of the defined types were placed in this encounter.   Signed,  [name]. Izen Petz, MD   Outpatient Encounter Medications as of 08/11/2019  Medication Sig  . fexofenadine (ALLEGRA) 180 MG tablet Take 180 mg by mouth daily as needed.   . Multiple Vitamins-Minerals (PRESERVISION AREDS 2) CAPS Take by mouth daily.  Marland Kitchen omeprazole (PRILOSEC) 20 MG capsule Take 1 capsule (20 mg total) by mouth daily.  . Poison Ivy Treatments (ZANFEL EX) Apply topically as needed.  . vitamin B-12 (CYANOCOBALAMIN) 1000 MCG tablet Take 1,000 mcg by mouth every other day.   No facility-administered encounter medications on file as of 08/11/2019.

## 2019-08-14 DIAGNOSIS — H353221 Exudative age-related macular degeneration, left eye, with active choroidal neovascularization: Secondary | ICD-10-CM | POA: Diagnosis not present

## 2019-08-14 DIAGNOSIS — H353231 Exudative age-related macular degeneration, bilateral, with active choroidal neovascularization: Secondary | ICD-10-CM | POA: Diagnosis not present

## 2019-08-14 DIAGNOSIS — H353211 Exudative age-related macular degeneration, right eye, with active choroidal neovascularization: Secondary | ICD-10-CM | POA: Diagnosis not present

## 2019-10-06 ENCOUNTER — Ambulatory Visit (INDEPENDENT_AMBULATORY_CARE_PROVIDER_SITE_OTHER): Payer: Medicare Other | Admitting: Internal Medicine

## 2019-10-06 ENCOUNTER — Encounter: Payer: Self-pay | Admitting: Internal Medicine

## 2019-10-06 DIAGNOSIS — K219 Gastro-esophageal reflux disease without esophagitis: Secondary | ICD-10-CM

## 2019-10-06 DIAGNOSIS — L989 Disorder of the skin and subcutaneous tissue, unspecified: Secondary | ICD-10-CM | POA: Diagnosis not present

## 2019-10-06 MED ORDER — RANITIDINE HCL 150 MG PO CAPS: 150.0000 mg | ORAL_CAPSULE | Freq: Two times a day (BID) | ORAL | 11 refills | Status: DC

## 2019-10-06 NOTE — Assessment & Plan Note (Signed)
This is suspicious for [facility] or [facility] Daughter has dermatologist she can get her in with--probably needs shave excision

## 2019-10-06 NOTE — Progress Notes (Signed)
Subjective:    Patient ID: Sherry Phillips, female    DOB: 1934/03/14, 86 y.o.   MRN: [id number]  HPI Here due to a suspicious skin lesion on chest This visit occurred during the SARS-CoV-2 public health emergency.  Safety protocols were in place, including screening questions prior to the visit, additional usage of staff PPE, and extensive cleaning of exam room while observing appropriate contact time as indicated for disinfecting solutions.   Noticed it about 1-2 weeks ago Seems to be about the same size Somewhat tender Children insisted she come get it checked  Wants to go back on ranitidine since it is back  Current Outpatient Medications on File Prior to Visit  Medication Sig Dispense Refill  . fexofenadine (ALLEGRA) 180 MG tablet Take 180 mg by mouth daily as needed.     . Multiple Vitamins-Minerals (PRESERVISION AREDS 2) CAPS Take by mouth daily.    Marland Kitchen omeprazole (PRILOSEC) 20 MG capsule Take 1 capsule (20 mg total) by mouth daily. 90 capsule 3  . Poison Ivy Treatments (ZANFEL EX) Apply topically as needed.    . vitamin B-12 (CYANOCOBALAMIN) 1000 MCG tablet Take 1,000 mcg by mouth every other day.     No current facility-administered medications on file prior to visit.    Allergies  Allergen Reactions  . Egg Donia Pounds, Egg] Rash    Past Medical History:  Diagnosis Date  . GERD (gastroesophageal reflux disease)   . Hiatal hernia   . Hyperlipidemia   . Osteoarthritis     Past Surgical History:  Procedure Laterality Date  . APPENDECTOMY  1962   with ruptured ovary repair  . BREAST BIOPSY  1970  . CATARACT EXTRACTION     both eyes  . ROBOTIC ASSISTED LAPAROSCOPIC REPAIR OF PARAESOPHAGEAL HERNIA  01/2017   Dr Grandville Silos at [facility] History  Problem Relation Age of Onset  . Hypertension Mother   . Leukemia Mother   . COPD Father   . Diabetes Sister        borderline  . Coronary artery disease Brother   . Lung disease Brother   . Cancer Neg  Hx        no breast or colon cancer    Social History   Socioeconomic History  . Marital status: Widowed    Spouse name: Not on file  . Number of children: 2  . Years of education: Not on file  . Highest education level: Not on file  Occupational History  . Occupation: homemaker  Tobacco Use  . Smoking status: Never Smoker  . Smokeless tobacco: Never Used  Vaping Use  . Vaping Use: Never used  Substance and Sexual Activity  . Alcohol use: No  . Drug use: No  . Sexual activity: Never  Other Topics Concern  . Not on file  Social History Narrative   Widowed 2015      One son and one daughter, [name]   Has living will.  Requests daughter to make decisions if needed.    Would accept CPR at this point, but no prolonged artificial ventilation.     Would not want tube feeds.   Social Determinants of Health   Financial Resource Strain:   . Difficulty of Paying Living Expenses:   Food Insecurity:   . Worried About Charity fundraiser in the Last Year:   . [place] in the Last Year:   Transportation Needs:   . Lack of  Transportation (Medical):   Marland Kitchen Lack of Transportation (Non-Medical):   Physical Activity:   . Days of Exercise per Week:   . Minutes of Exercise per Session:   Stress:   . Feeling of Stress :   Social Connections:   . Frequency of Communication with Friends and Family:   . Frequency of Social Gatherings with Friends and Family:   . Attends Religious Services:   . Active Member of Clubs or Organizations:   . Attends Archivist Meetings:   Marland Kitchen Marital Status:   Intimate Partner Violence:   . Fear of Current or Ex-Partner:   . Emotionally Abused:   Marland Kitchen Physically Abused:   . Sexually Abused:    Review of Systems  No dysphagia Appetite is fine     Objective:   Physical Exam Skin:    Comments: 6-7 mm raised hexagonal red lesion with some central crust            Assessment & Plan:

## 2019-10-06 NOTE — Assessment & Plan Note (Signed)
Will switch back to ranitidine

## 2019-10-06 NOTE — Patient Instructions (Signed)
I do think that might be an early skin cancer. Please have Sonja set you up for a dermatology evaluation

## 2019-10-07 ENCOUNTER — Telehealth: Payer: Self-pay

## 2019-10-07 NOTE — Telephone Encounter (Signed)
Sherry Phillips with [name] left v/m that received rx for zantac 150 mg caps and that med has been d/c. Request different med sent to [place].

## 2019-10-08 NOTE — Telephone Encounter (Signed)
Spoke to pt. She will either try famotidine or stay on the omeprazole.

## 2019-10-08 NOTE — Telephone Encounter (Signed)
Call patient She told me it was back----if we can't find it, she needs to continue the omeprazole

## 2019-10-14 DIAGNOSIS — L821 Other seborrheic keratosis: Secondary | ICD-10-CM | POA: Diagnosis not present

## 2019-10-14 DIAGNOSIS — L858 Other specified epidermal thickening: Secondary | ICD-10-CM | POA: Diagnosis not present

## 2019-10-14 DIAGNOSIS — D492 Neoplasm of unspecified behavior of bone, soft tissue, and skin: Secondary | ICD-10-CM | POA: Diagnosis not present

## 2019-10-14 DIAGNOSIS — D229 Melanocytic nevi, unspecified: Secondary | ICD-10-CM | POA: Diagnosis not present

## 2019-10-14 DIAGNOSIS — L814 Other melanin hyperpigmentation: Secondary | ICD-10-CM | POA: Diagnosis not present

## 2019-10-15 DIAGNOSIS — H353221 Exudative age-related macular degeneration, left eye, with active choroidal neovascularization: Secondary | ICD-10-CM | POA: Diagnosis not present

## 2019-10-15 DIAGNOSIS — H353231 Exudative age-related macular degeneration, bilateral, with active choroidal neovascularization: Secondary | ICD-10-CM | POA: Diagnosis not present

## 2019-10-15 DIAGNOSIS — H353211 Exudative age-related macular degeneration, right eye, with active choroidal neovascularization: Secondary | ICD-10-CM | POA: Diagnosis not present

## 2019-12-19 ENCOUNTER — Encounter: Payer: Self-pay | Admitting: Internal Medicine

## 2019-12-25 DIAGNOSIS — H353221 Exudative age-related macular degeneration, left eye, with active choroidal neovascularization: Secondary | ICD-10-CM | POA: Diagnosis not present

## 2019-12-31 DIAGNOSIS — H353211 Exudative age-related macular degeneration, right eye, with active choroidal neovascularization: Secondary | ICD-10-CM | POA: Diagnosis not present

## 2020-01-26 DIAGNOSIS — M65342 Trigger finger, left ring finger: Secondary | ICD-10-CM | POA: Diagnosis not present

## 2020-01-26 DIAGNOSIS — M65332 Trigger finger, left middle finger: Secondary | ICD-10-CM | POA: Diagnosis not present

## 2020-01-26 DIAGNOSIS — M65352 Trigger finger, left little finger: Secondary | ICD-10-CM | POA: Diagnosis not present

## 2020-02-18 DIAGNOSIS — H353221 Exudative age-related macular degeneration, left eye, with active choroidal neovascularization: Secondary | ICD-10-CM | POA: Diagnosis not present

## 2020-03-03 DIAGNOSIS — H353211 Exudative age-related macular degeneration, right eye, with active choroidal neovascularization: Secondary | ICD-10-CM | POA: Diagnosis not present

## 2020-04-14 DIAGNOSIS — H353221 Exudative age-related macular degeneration, left eye, with active choroidal neovascularization: Secondary | ICD-10-CM | POA: Diagnosis not present

## 2020-04-28 DIAGNOSIS — H353221 Exudative age-related macular degeneration, left eye, with active choroidal neovascularization: Secondary | ICD-10-CM | POA: Diagnosis not present

## 2020-05-10 ENCOUNTER — Ambulatory Visit: Payer: Medicare Other | Admitting: Internal Medicine

## 2020-05-31 ENCOUNTER — Observation Stay
Admission: EM | Admit: 2020-05-31 | Discharge: 2020-06-01 | Disposition: A | Discharge status: Home / Self Care | Payer: Medicare Other | Attending: Internal Medicine | Admitting: Internal Medicine

## 2020-05-31 ENCOUNTER — Encounter: Payer: Self-pay | Admitting: Emergency Medicine

## 2020-05-31 ENCOUNTER — Other Ambulatory Visit: Payer: Self-pay

## 2020-05-31 ENCOUNTER — Emergency Department: Payer: Medicare Other

## 2020-05-31 ENCOUNTER — Observation Stay: Payer: Medicare Other

## 2020-05-31 DIAGNOSIS — I160 Hypertensive urgency: Secondary | ICD-10-CM

## 2020-05-31 DIAGNOSIS — E161 Other hypoglycemia: Secondary | ICD-10-CM | POA: Diagnosis not present

## 2020-05-31 DIAGNOSIS — Z79899 Other long term (current) drug therapy: Secondary | ICD-10-CM | POA: Diagnosis not present

## 2020-05-31 DIAGNOSIS — Z8673 Personal history of transient ischemic attack (TIA), and cerebral infarction without residual deficits: Secondary | ICD-10-CM | POA: Diagnosis present

## 2020-05-31 DIAGNOSIS — G454 Transient global amnesia: Secondary | ICD-10-CM | POA: Diagnosis not present

## 2020-05-31 DIAGNOSIS — R2689 Other abnormalities of gait and mobility: Secondary | ICD-10-CM | POA: Diagnosis not present

## 2020-05-31 DIAGNOSIS — R419 Unspecified symptoms and signs involving cognitive functions and awareness: Secondary | ICD-10-CM

## 2020-05-31 DIAGNOSIS — E162 Hypoglycemia, unspecified: Secondary | ICD-10-CM | POA: Diagnosis not present

## 2020-05-31 DIAGNOSIS — R9431 Abnormal electrocardiogram [ECG] [EKG]: Secondary | ICD-10-CM | POA: Diagnosis not present

## 2020-05-31 DIAGNOSIS — R29818 Other symptoms and signs involving the nervous system: Secondary | ICD-10-CM | POA: Diagnosis not present

## 2020-05-31 DIAGNOSIS — E785 Hyperlipidemia, unspecified: Secondary | ICD-10-CM | POA: Diagnosis not present

## 2020-05-31 DIAGNOSIS — R479 Unspecified speech disturbances: Secondary | ICD-10-CM | POA: Diagnosis not present

## 2020-05-31 DIAGNOSIS — I6782 Cerebral ischemia: Secondary | ICD-10-CM | POA: Insufficient documentation

## 2020-05-31 DIAGNOSIS — R4781 Slurred speech: Secondary | ICD-10-CM | POA: Diagnosis not present

## 2020-05-31 DIAGNOSIS — R404 Transient alteration of awareness: Secondary | ICD-10-CM | POA: Diagnosis not present

## 2020-05-31 DIAGNOSIS — I10 Essential (primary) hypertension: Secondary | ICD-10-CM | POA: Insufficient documentation

## 2020-05-31 DIAGNOSIS — Z8249 Family history of ischemic heart disease and other diseases of the circulatory system: Secondary | ICD-10-CM | POA: Diagnosis not present

## 2020-05-31 DIAGNOSIS — G459 Transient cerebral ischemic attack, unspecified: Secondary | ICD-10-CM

## 2020-05-31 DIAGNOSIS — R29810 Facial weakness: Secondary | ICD-10-CM | POA: Diagnosis not present

## 2020-05-31 DIAGNOSIS — I639 Cerebral infarction, unspecified: Secondary | ICD-10-CM | POA: Diagnosis not present

## 2020-05-31 DIAGNOSIS — R410 Disorientation, unspecified: Secondary | ICD-10-CM | POA: Insufficient documentation

## 2020-05-31 LAB — CBC
HCT: 43.1 % (ref 36.0–46.0)
HCT: 43 % (ref 36.0–46.0)
Hemoglobin: 14.2 g/dL (ref 12.0–15.0)
Hemoglobin: 14.2 g/dL (ref 12.0–15.0)
MCH: 30 pg (ref 26.0–34.0)
MCH: 29.7 pg (ref 26.0–34.0)
MCHC: 32.9 g/dL (ref 30.0–36.0)
MCHC: 33 g/dL (ref 30.0–36.0)
MCV: 91.1 fL (ref 80.0–100.0)
MCV: 90 fL (ref 80.0–100.0)
Platelets: 271 10*3/uL (ref 150–400)
Platelets: 269 10*3/uL (ref 150–400)
RBC: 4.73 MIL/uL (ref 3.87–5.11)
RBC: 4.78 MIL/uL (ref 3.87–5.11)
RDW: 13.8 % (ref 11.5–15.5)
RDW: 13.9 % (ref 11.5–15.5)
WBC: 10.9 10*3/uL — ABNORMAL HIGH (ref 4.0–10.5)
WBC: 9.3 10*3/uL (ref 4.0–10.5)
nRBC: 0 % (ref 0.0–0.2)
nRBC: 0 % (ref 0.0–0.2)

## 2020-05-31 LAB — DIFFERENTIAL
Abs Immature Granulocytes: 0.05 10*3/uL (ref 0.00–0.07)
Basophils Absolute: 0.1 10*3/uL (ref 0.0–0.1)
Basophils Relative: 1 %
Eosinophils Absolute: 0.3 10*3/uL (ref 0.0–0.5)
Eosinophils Relative: 3 %
Immature Granulocytes: 1 %
Lymphocytes Relative: 31 %
Lymphs Abs: 3.4 10*3/uL (ref 0.7–4.0)
Monocytes Absolute: 1.1 10*3/uL — ABNORMAL HIGH (ref 0.1–1.0)
Monocytes Relative: 10 %
Neutro Abs: 6 10*3/uL (ref 1.7–7.7)
Neutrophils Relative %: 54 %

## 2020-05-31 LAB — COMPREHENSIVE METABOLIC PANEL
ALT: 10 U/L (ref 0–44)
AST: 17 U/L (ref 15–41)
Albumin: 3.9 g/dL (ref 3.5–5.0)
Alkaline Phosphatase: 75 U/L (ref 38–126)
Anion gap: 10 (ref 5–15)
BUN: 14 mg/dL (ref 8–23)
CO2: 25 mmol/L (ref 22–32)
Calcium: 8.8 mg/dL — ABNORMAL LOW (ref 8.9–10.3)
Chloride: 104 mmol/L (ref 98–111)
Creatinine, Ser: 1.09 mg/dL — ABNORMAL HIGH (ref 0.44–1.00)
GFR, Estimated: 50 mL/min — ABNORMAL LOW (ref >60)
Glucose, Bld: 115 mg/dL — ABNORMAL HIGH (ref 70–99)
Potassium: 3.2 mmol/L — ABNORMAL LOW (ref 3.5–5.1)
Sodium: 139 mmol/L (ref 135–145)
Total Bilirubin: 0.8 mg/dL (ref 0.3–1.2)
Total Protein: 6.8 g/dL (ref 6.5–8.1)

## 2020-05-31 LAB — PROTIME-INR
INR: 0.9 (ref 0.8–1.2)
Prothrombin Time: 12.2 seconds (ref 11.4–15.2)

## 2020-05-31 LAB — CBG MONITORING, ED: Glucose-Capillary: 122 mg/dL — ABNORMAL HIGH (ref 70–99)

## 2020-05-31 LAB — CREATININE, SERUM
Creatinine, Ser: 1.06 mg/dL — ABNORMAL HIGH (ref 0.44–1.00)
GFR, Estimated: 52 mL/min — ABNORMAL LOW (ref >60)

## 2020-05-31 LAB — APTT: aPTT: 31 seconds (ref 24–36)

## 2020-05-31 IMAGING — CT CT HEAD CODE STROKE
3 series · 15 of 46 positions shown, 18 images · non-contrast
Comparison: None.

CLINICAL DATA: Code stroke. Slurred speech and confusion with
resolution.

EXAM:
CT HEAD WITHOUT CONTRAST
TECHNIQUE: Contiguous axial images were obtained from the base of the skull
through the vertex without intravenous contrast.

[Series 3: head wo · axial · 0.41mm/px · z∈[-124,-4]mm · 9 of 29 slices shown, 12 images]
[im 3/29  brain]
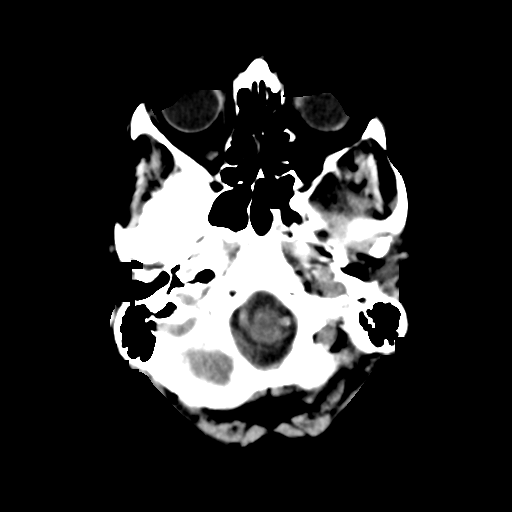
[im 3/29  bone]
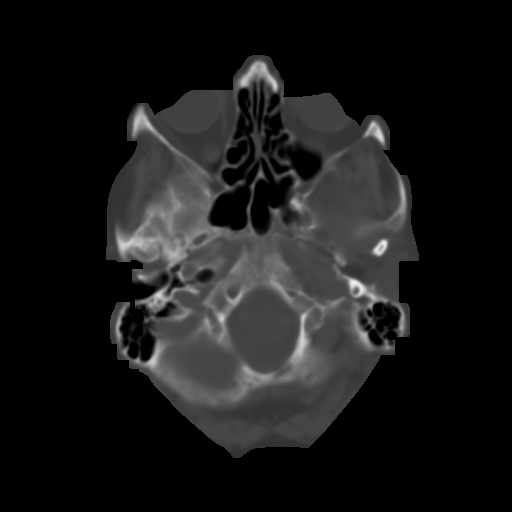
[im 6/29  brain]
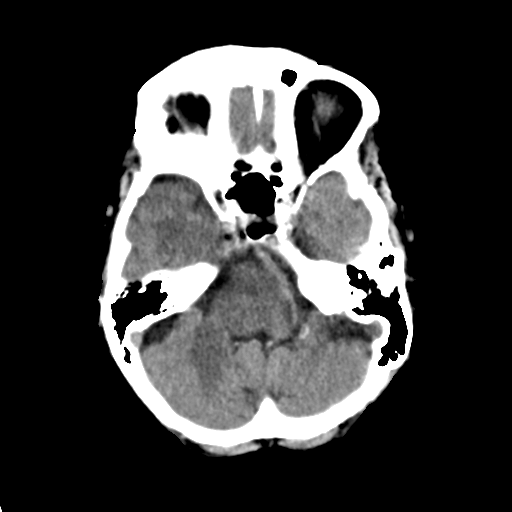
[im 9/29  brain]
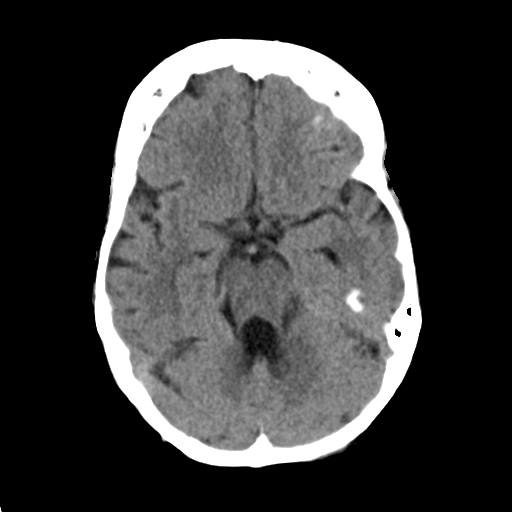
[im 12/29  brain]
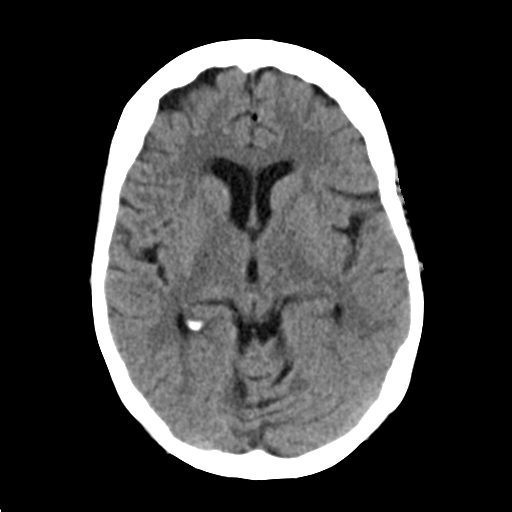
[im 15/29  brain]
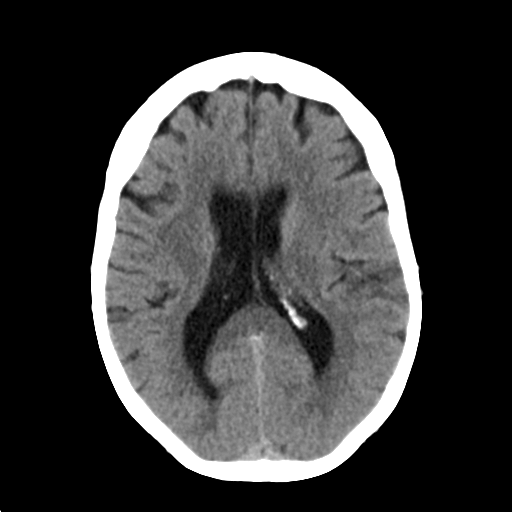
[im 15/29  bone]
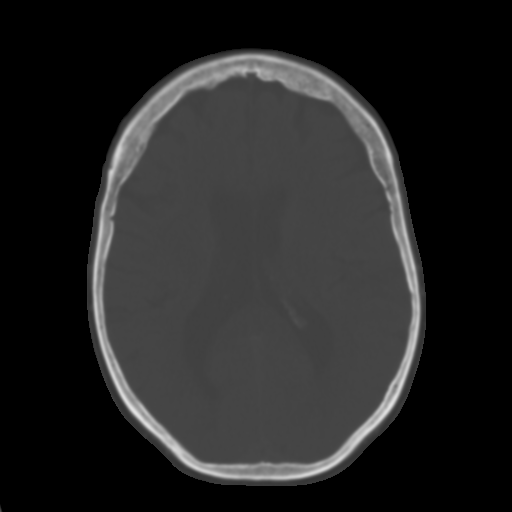
[im 18/29  brain]
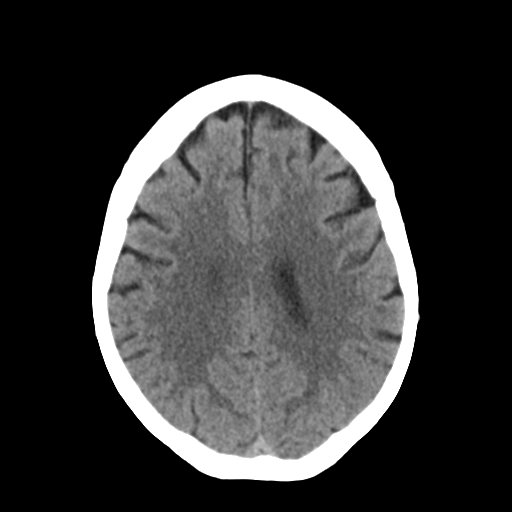
[im 21/29  brain]
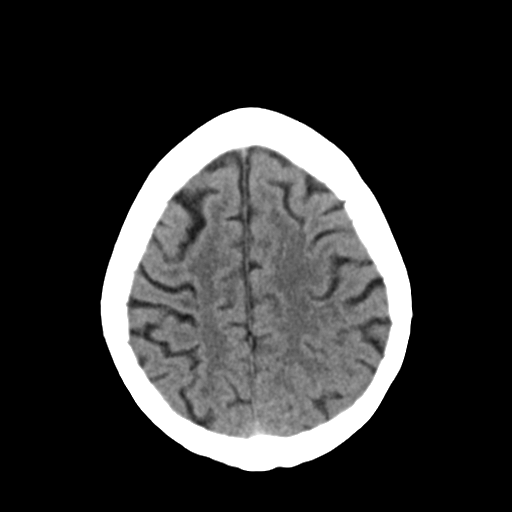
[im 24/29  brain]
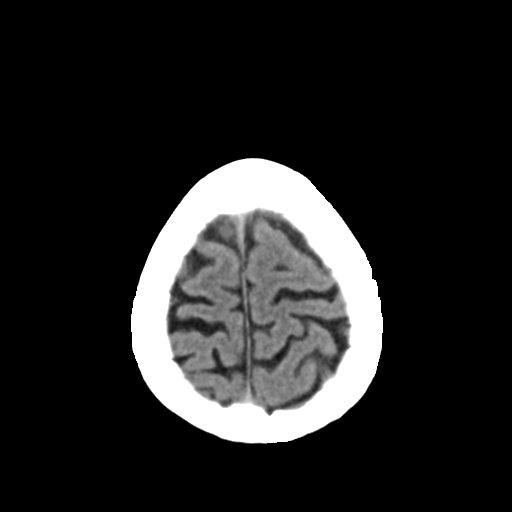
[im 27/29  brain]
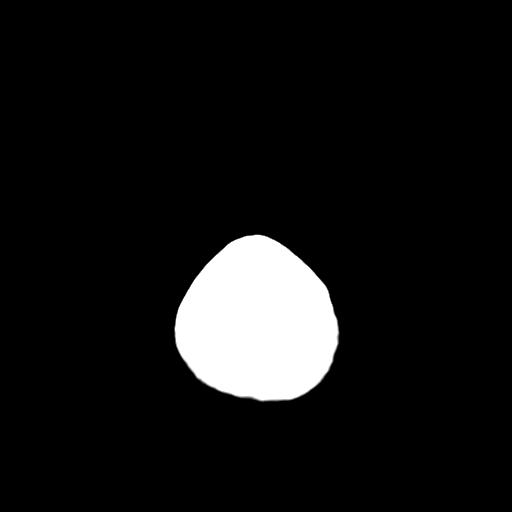
[im 27/29  bone]
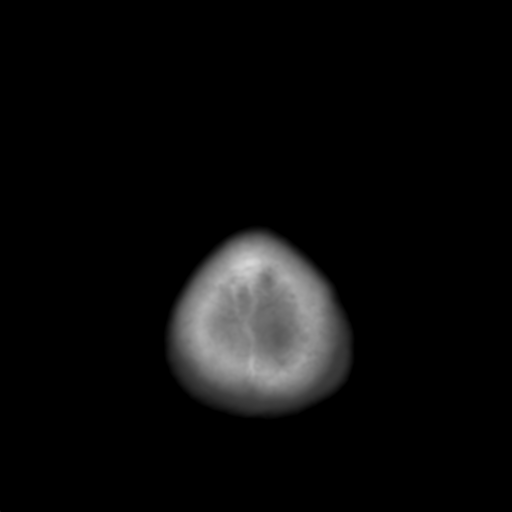

[Series 5: coronal soft tissue · coronal · 0.29mm/px · 3 of 63 slices shown]
[im 21/63  brain]
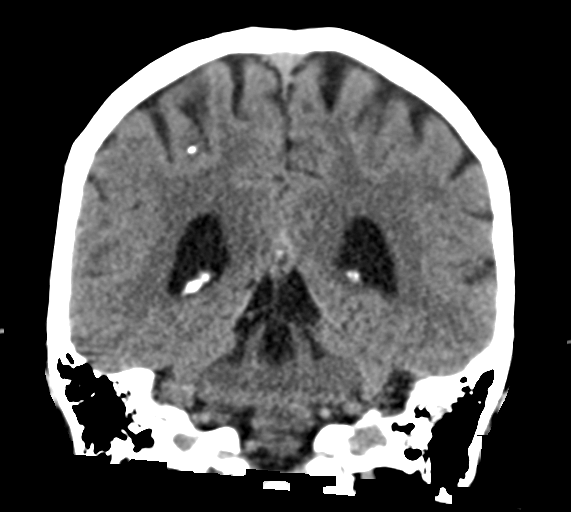
[im 28/63  brain]
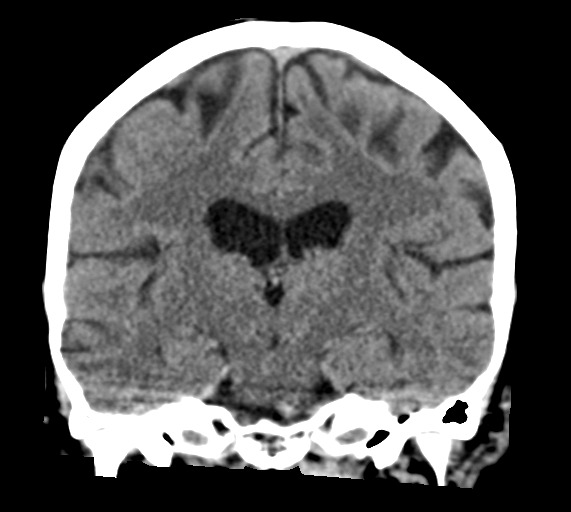
[im 35/63  brain]
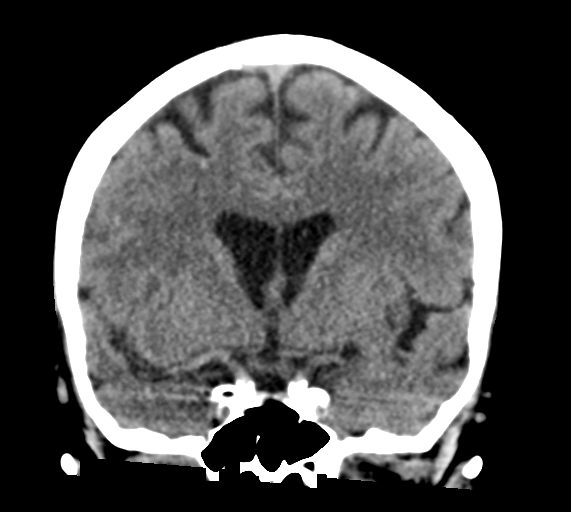

[Series 6: sagittal soft tissue · sagittal · 0.29mm/px · 3 of 48 slices shown]
[im 16/48  brain]
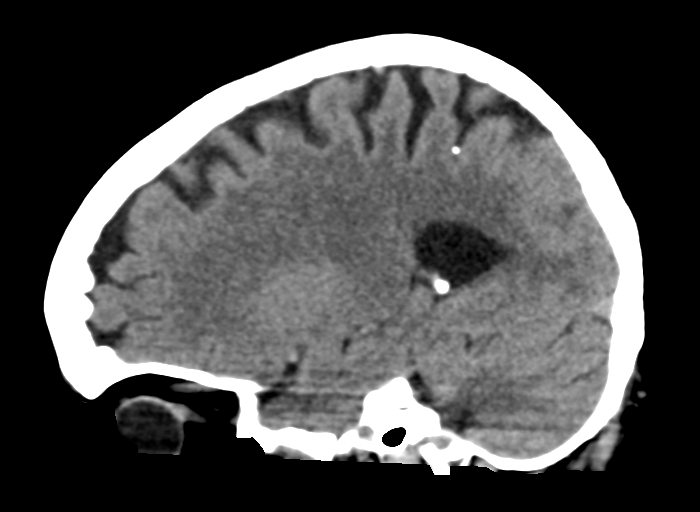
[im 24/48  brain]
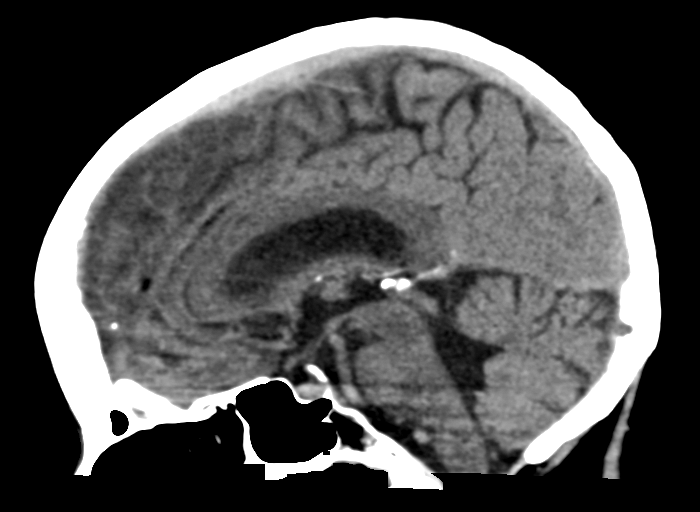
[im 32/48  brain]
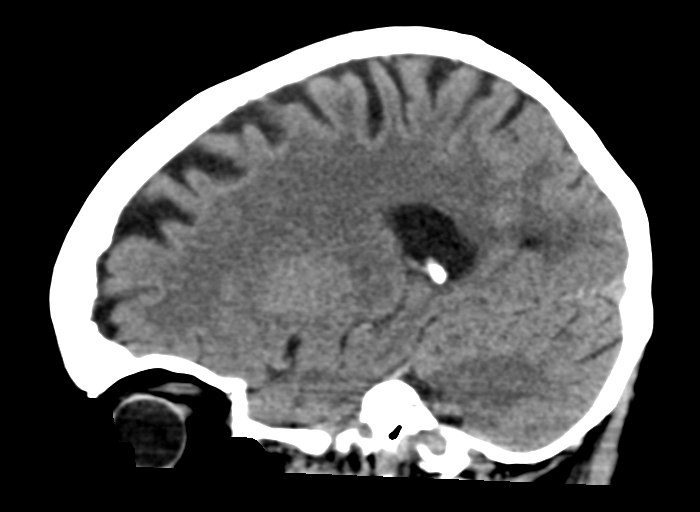

[15 of 46 positions shown; findings below may reference images not displayed]

FINDINGS: Brain: No focal abnormality affects the brainstem or cerebellum.
Cerebral hemispheres show mild chronic small-vessel ischemic change
of the white matter. Punctate focal calcification at the right
parietal vertex region could be a benign parenchymal calcification
or a small bit of embolized calcific plaque. No evidence of acute
infarction in the region. No mass, hemorrhage, hydrocephalus or
extra-axial collection.

Vascular: There is atherosclerotic calcification of the major
vessels at the base of the brain.

Skull: Negative

Sinuses/Orbits: Clear/normal

Other: None

ASPECTS ([place] Stroke Program Early CT Score)

- Ganglionic level infarction (caudate, lentiform nuclei, internal
capsule, insula, M1-M3 cortex): 7

- Supraganglionic infarction (M4-M6 cortex): 3

Total score (0-10 with 10 being normal): 10
IMPRESSION: 1. No acute finding by CT. Mild chronic small-vessel ischemic change
of the cerebral hemispheric white matter. Punctate focal
calcification at the right parietal vertex region could be a benign
parenchymal calcification or a small bit of embolized calcific
plaque. No evidence of acute stroke associated with that however.
2. ASPECTS is 10.

## 2020-05-31 IMAGING — MR MR HEAD W/O CM
11 of 12 series · 34 of 48 positions shown · non-contrast
Comparison: CT head [DATE]

CLINICAL DATA: Stroke.  Slurred speech

EXAM:
MRI HEAD WITHOUT CONTRAST
TECHNIQUE: Multiplanar, multiecho pulse sequences of the brain and surrounding
structures were obtained without intravenous contrast.

[Series 5: ax dwi_tracew · axial · 3.0mm · 0.65mm/px · z∈[-122,+31]mm · 3 of 48 slices shown]
[im 1/48]
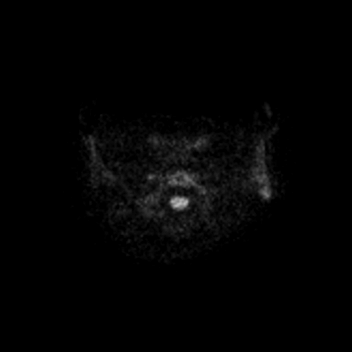
[im 24/48]
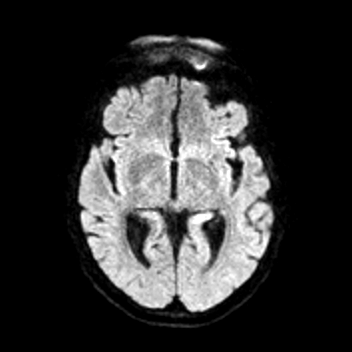
[im 48/48]
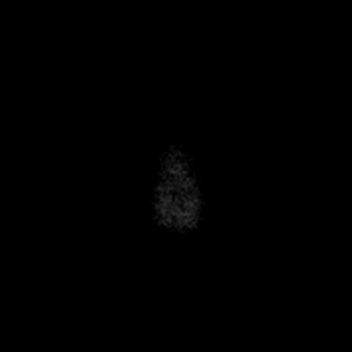

[Series 6: ax dwi_adc · axial · 3.0mm · 0.65mm/px · z∈[-122,+31]mm · 3 of 48 slices shown]
[im 1/48]
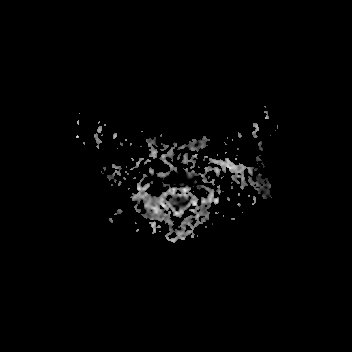
[im 24/48]
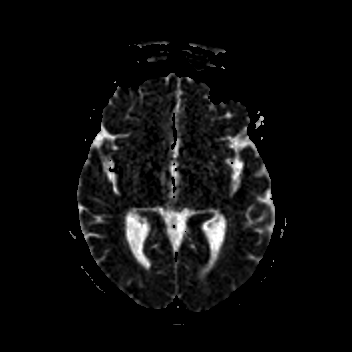
[im 48/48]
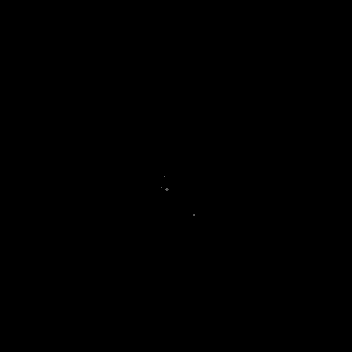

[Series 11: FLAIR · axial · 3.0mm · 0.53mm/px · z∈[-125,+36]mm · 3 of 55 slices shown]
[im 1/55]
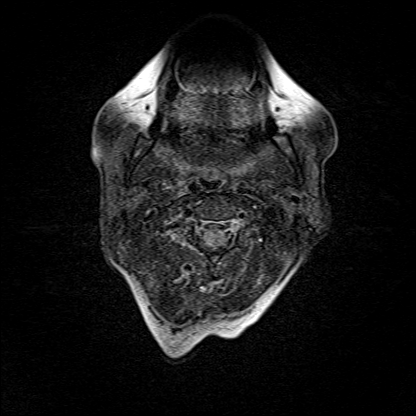
[im 28/55]
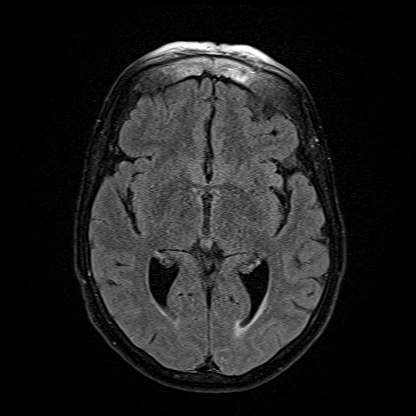
[im 55/55]
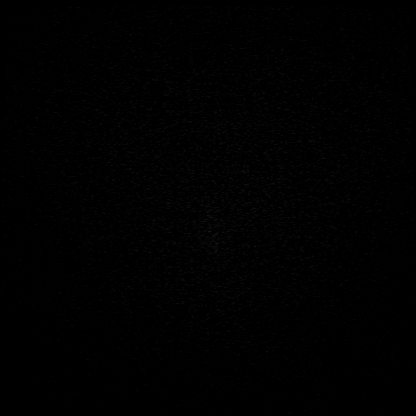

[Series 12: TOF · axial · 0.5mm · 0.41mm/px · z∈[-113,-12]mm · 8 of 205 slices shown]
[im 1/205]
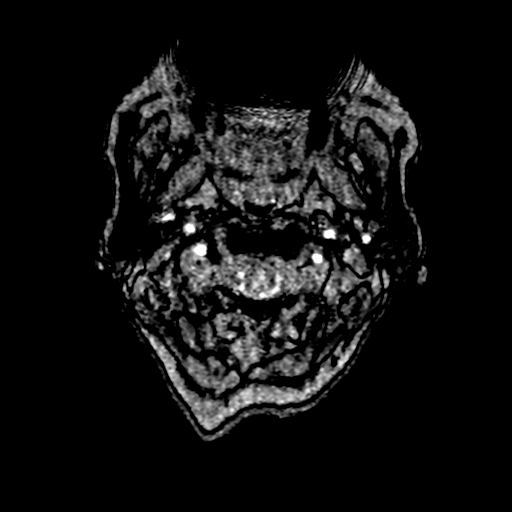
[im 38/205]
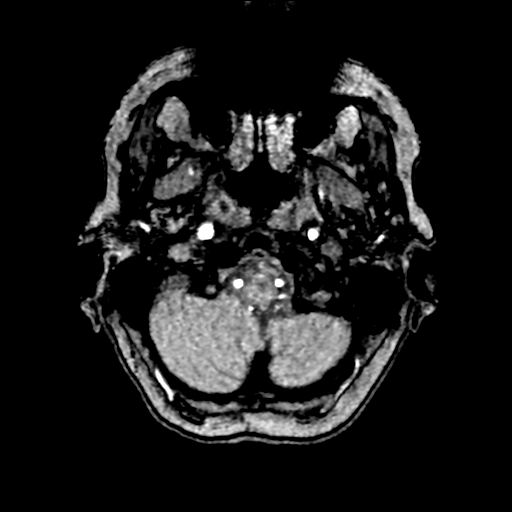
[im 56/205]
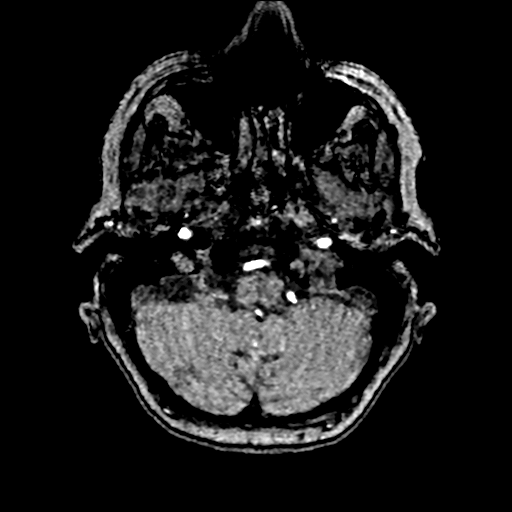
[im 93/205]
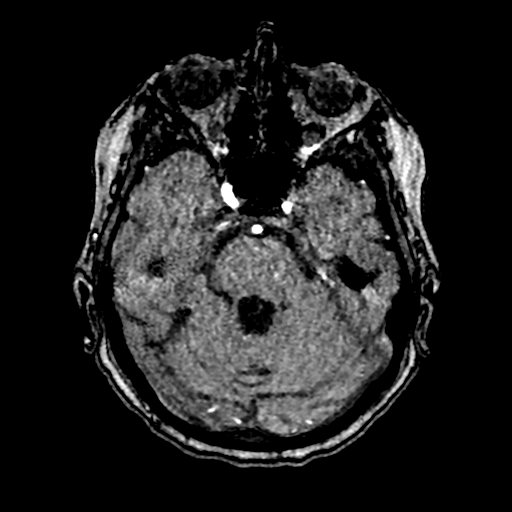
[im 112/205]
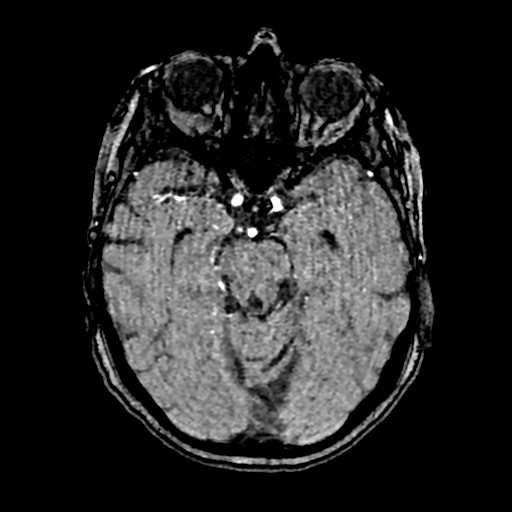
[im 149/205]
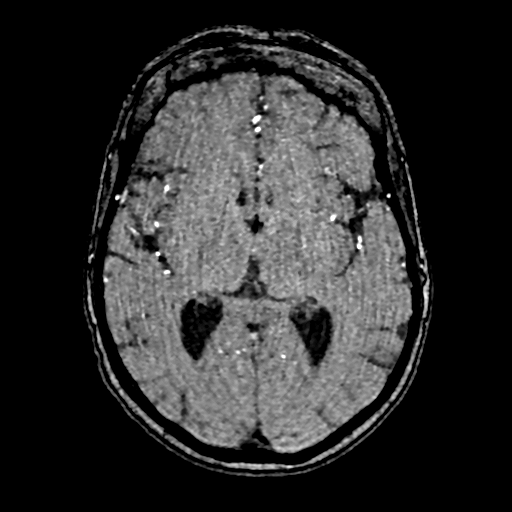
[im 167/205]
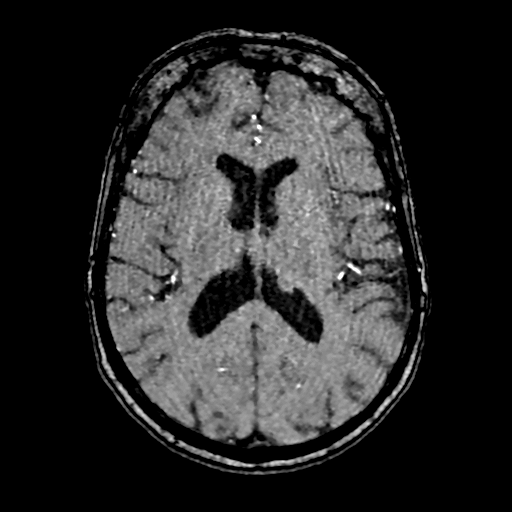
[im 205/205]
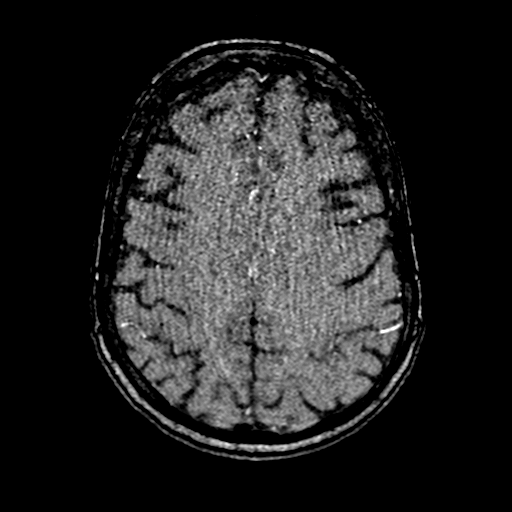

[Series 20: cor dwi_tracew · coronal · 5.0mm · 0.60mm/px · 2 of 38 slices shown]
[im 1/38]
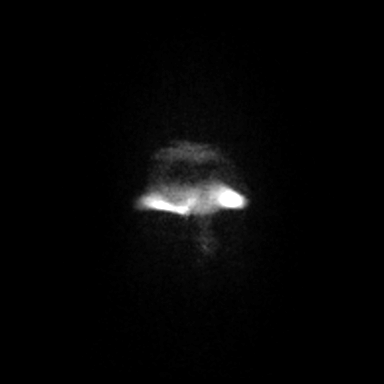
[im 38/38]
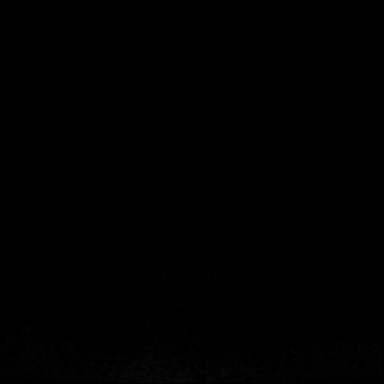

[Series 21: cor dwi_adc · coronal · 5.0mm · 0.60mm/px · 2 of 35 slices shown]
[im 1/35]
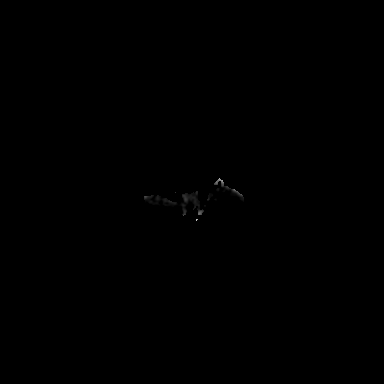
[im 35/35]
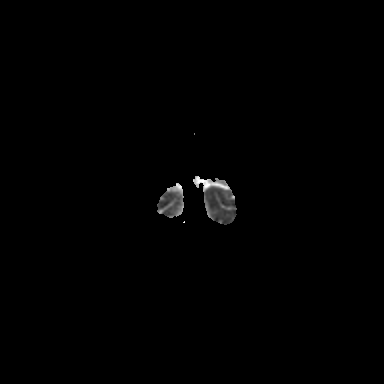

[Series 22: T1 · sagittal · 5.0mm · 0.62mm/px · 1 of 25 slices shown (1 of 2)]
[im 1/25]
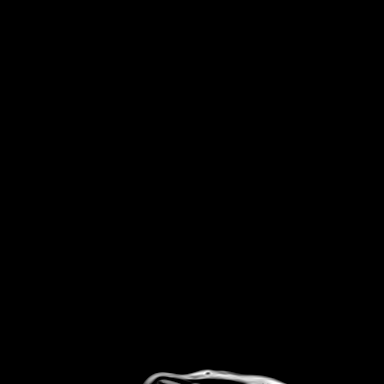

[Series 23: T2 · axial · 5.0mm · 0.53mm/px · 1 of 25 slices shown (1 of 2)]
[im 1/25]
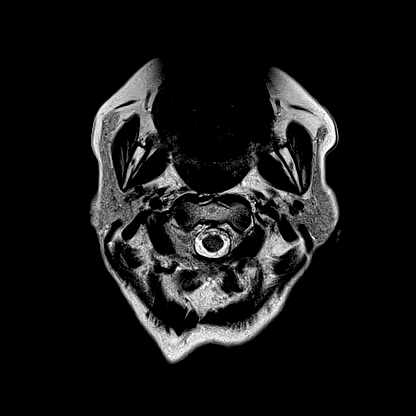

[Series 25: pha_images · axial · 3.0mm · 0.90mm/px · 1 of 59 slices shown]
[im 1/59]
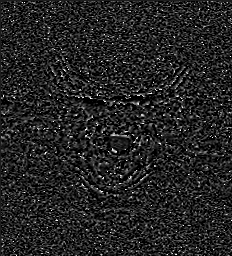

[Series 28: T1 · axial · 1.0mm · 0.98mm/px · z∈[-133,+40]mm · 8 of 176 slices shown (2 of 2)]
[im 1/176]
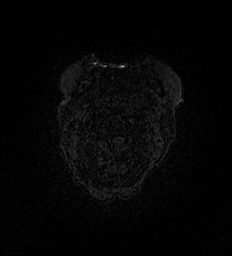
[im 36/176]
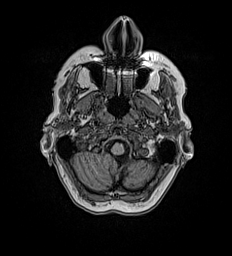
[im 53/176]
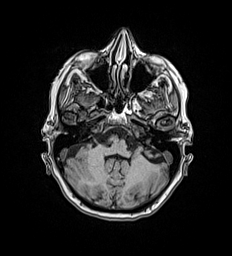
[im 71/176]
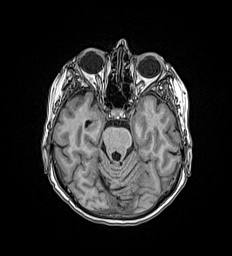
[im 106/176]
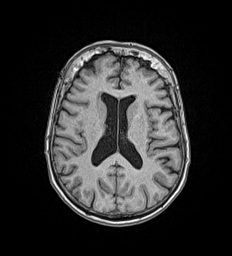
[im 123/176]
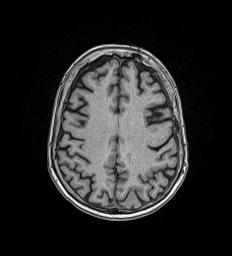
[im 141/176]
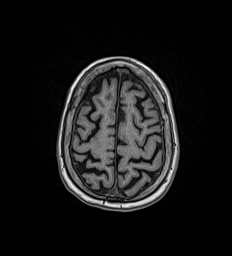
[im 176/176]
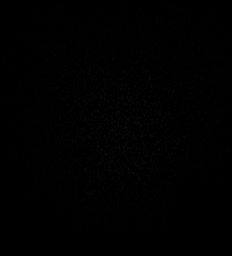

[Series 29: T2 · coronal · 5.0mm · 0.57mm/px · 2 of 29 slices shown (2 of 2)]
[im 1/29]
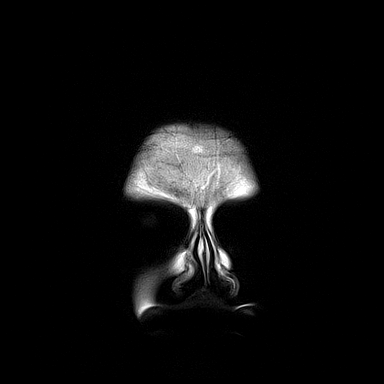
[im 29/29]
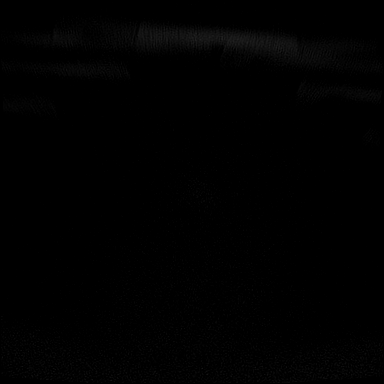

[34 of 48 positions shown; findings below may reference images not displayed]

FINDINGS: Brain: Negative for acute infarct. Minimal white matter changes.
Brainstem and cerebellum normal. Negative for hemorrhage or mass
lesion. Ventricle size normal.

Vascular: Normal arterial flow voids.

Skull and upper cervical spine: No focal skeletal abnormality.

Sinuses/Orbits: Mild mucosal edema paranasal sinuses. Bilateral
cataract extraction.

Other: None
IMPRESSION: No acute abnormality.  Normal for age.

## 2020-05-31 IMAGING — MR MR MRA NECK W/O CM
1 of 3 series · 20 of 48 positions shown · non-contrast
Comparison: MRI head [DATE]

CLINICAL DATA: Stroke.  Slurred speech.

EXAM:
MRA NECK WITHOUT CONTRAST
TECHNIQUE: Angiographic images of the neck were obtained using MRA technique
without intravenous contrast. Carotid stenosis measurements (when
applicable) are obtained utilizing NASCET criteria, using the distal
internal carotid diameter as the denominator.

[Series 12: TOF · axial · 0.5mm · 0.41mm/px · z∈[-113,-16]mm · 20 of 205 slices shown]
[im 1/205]
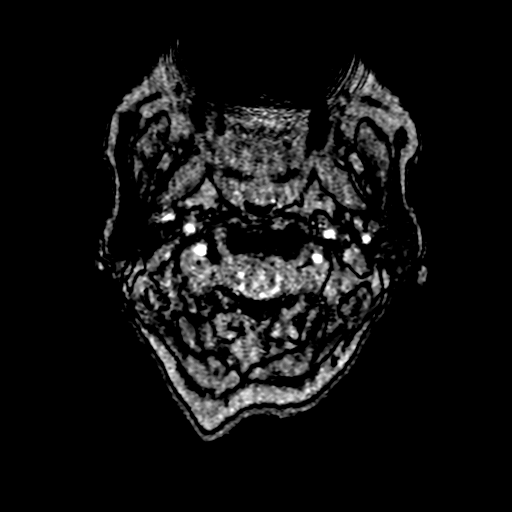
[im 8/205]
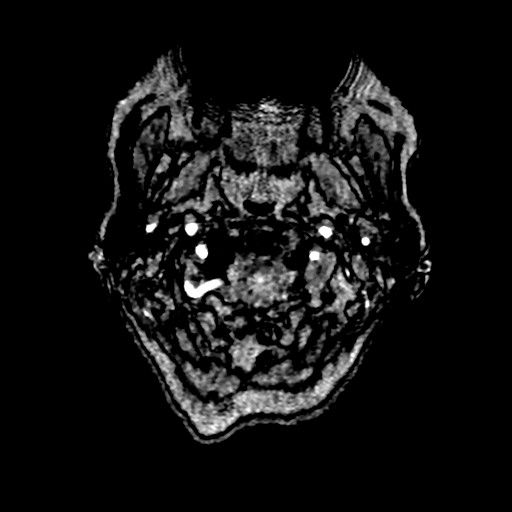
[im 16/205]
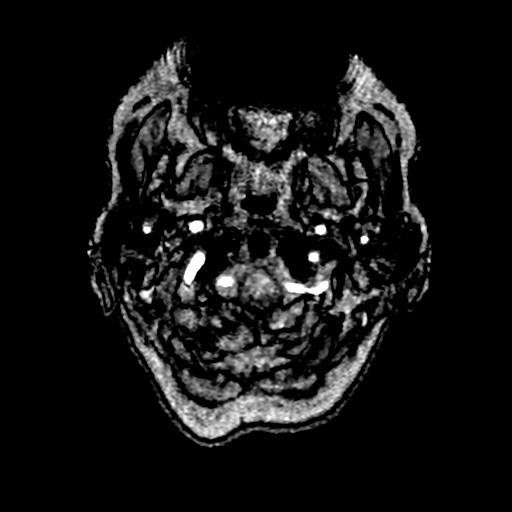
[im 23/205]
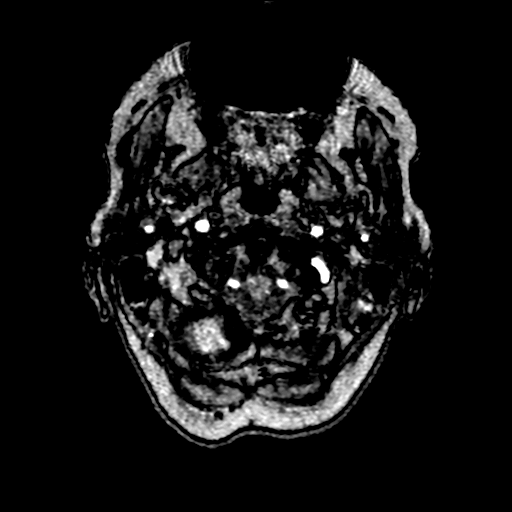
[im 31/205]
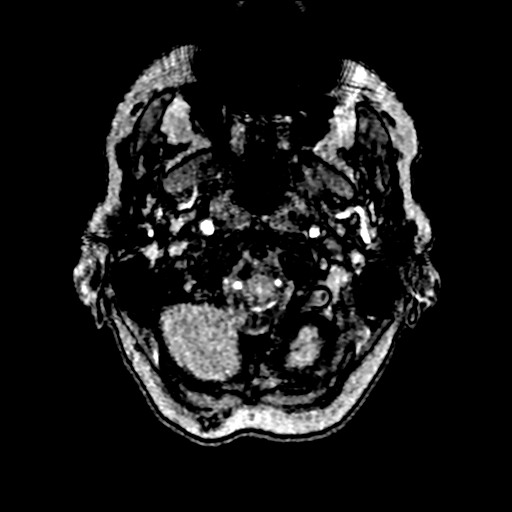
[im 38/205]
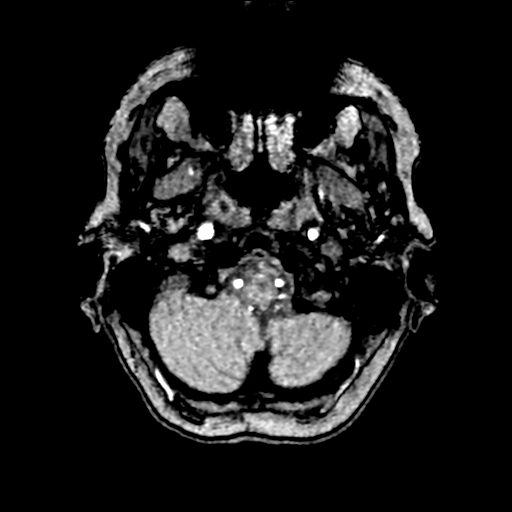
[im 46/205]
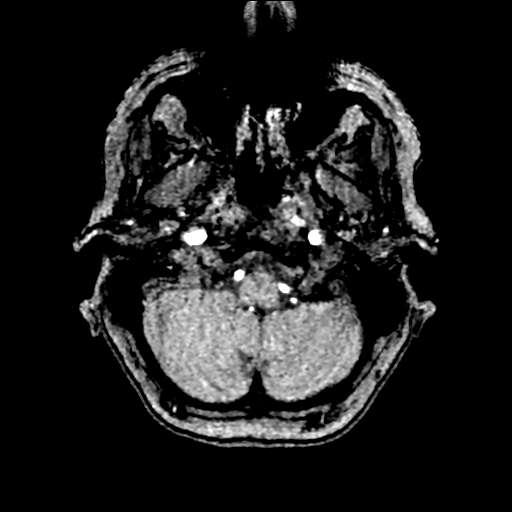
[im 53/205]
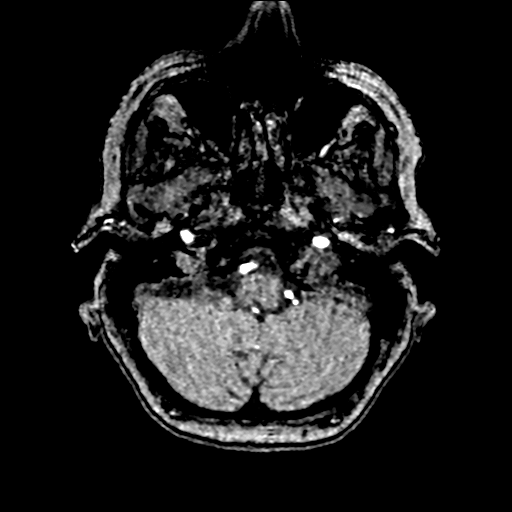
[im 61/205]
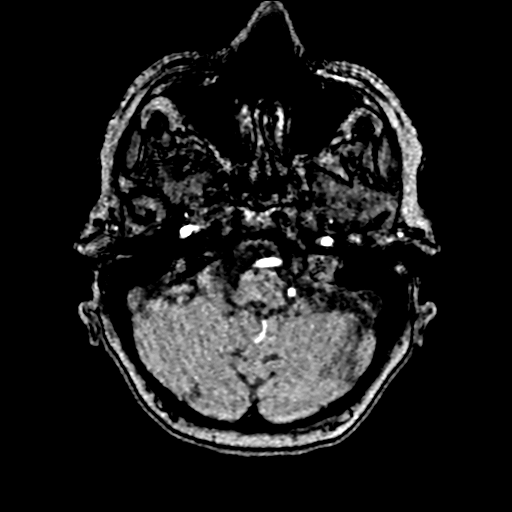
[im 69/205]
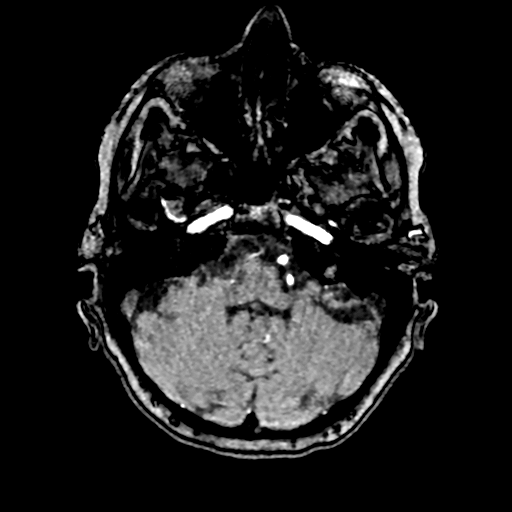
[im 76/205]
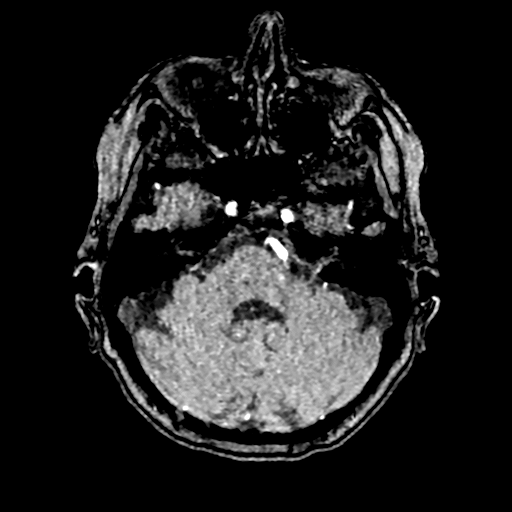
[im 84/205]
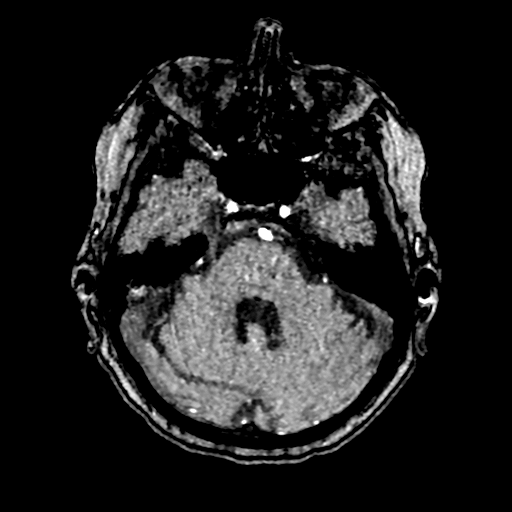
[im 91/205]
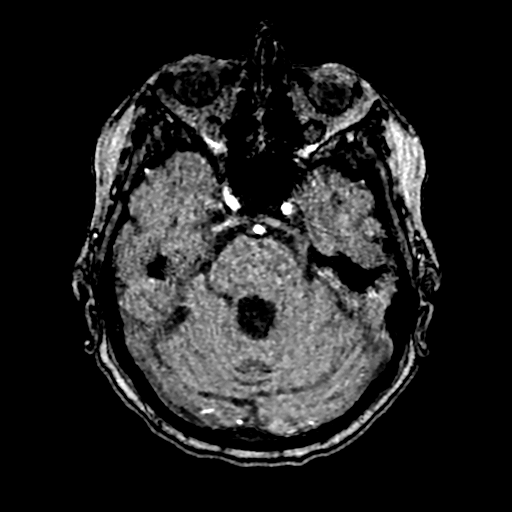
[im 99/205]
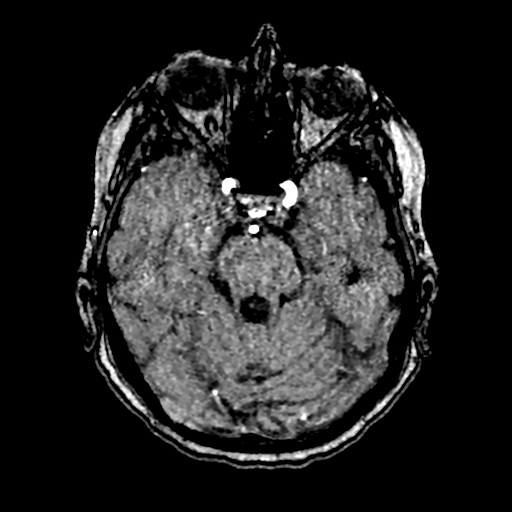
[im 106/205]
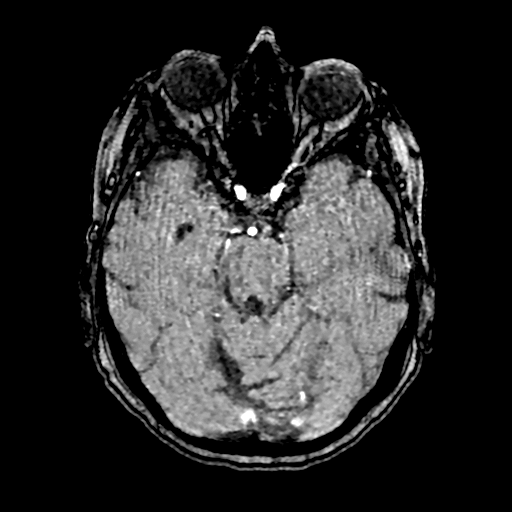
[im 114/205]
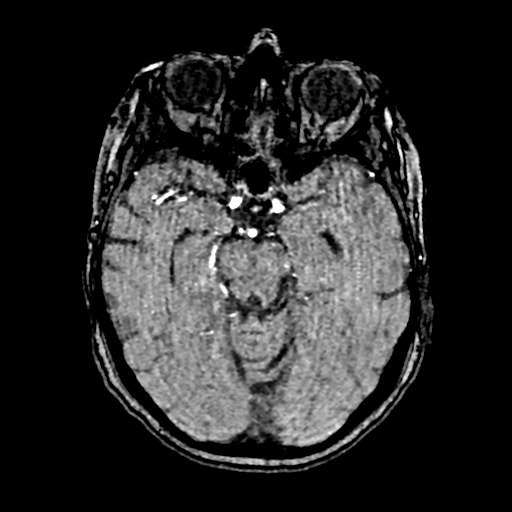
[im 144/205]
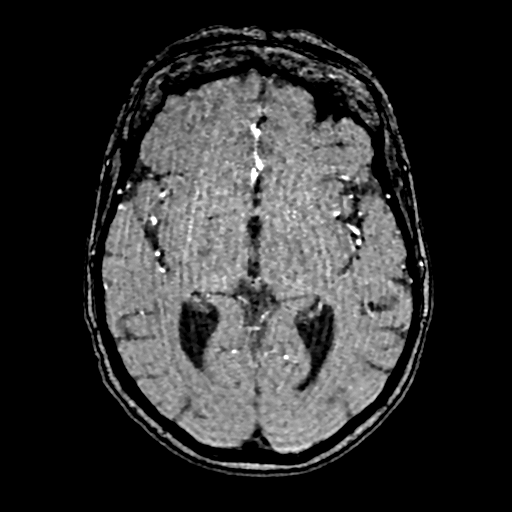
[im 167/205]
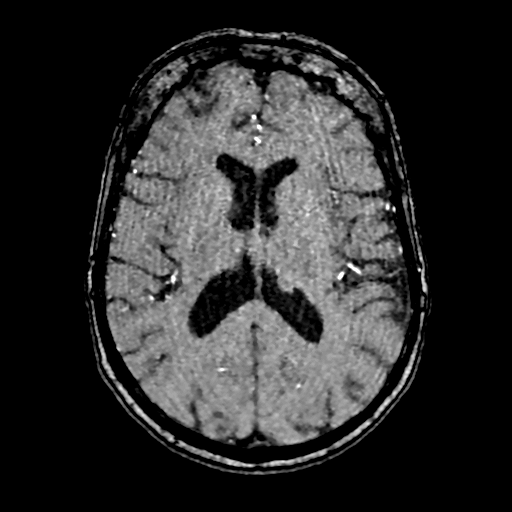
[im 174/205]
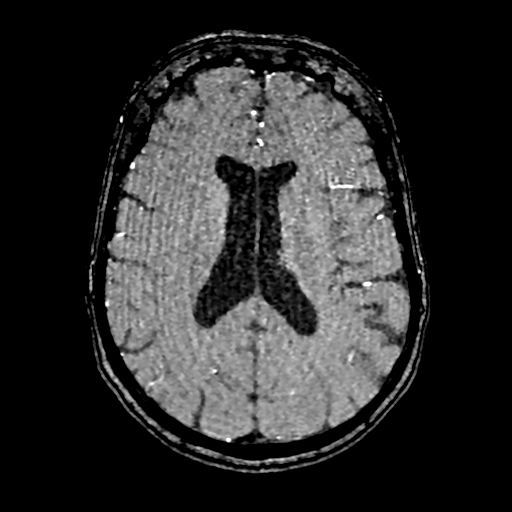
[im 197/205]
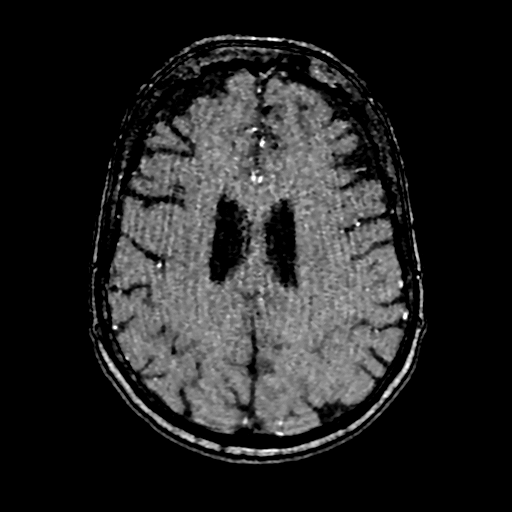

[20 of 48 positions shown; findings below may reference images not displayed]

FINDINGS: Antegrade flow in the carotid and vertebral arteries bilaterally.

Both carotids are widely patent without stenosis. Carotid
bifurcation negative. Both vertebral arteries widely patent.
IMPRESSION: Negative MRA neck

## 2020-05-31 IMAGING — CR DG CHEST 2V
1 series · 2 of 2 positions shown · non-contrast
Comparison: [DATE]

CLINICAL DATA: Slurred speech

EXAM:
CHEST - 2 VIEW

[Series 1: dg chest 2 view · 0.14mm/px · 2 of 2 slices shown]
[im 1/2]
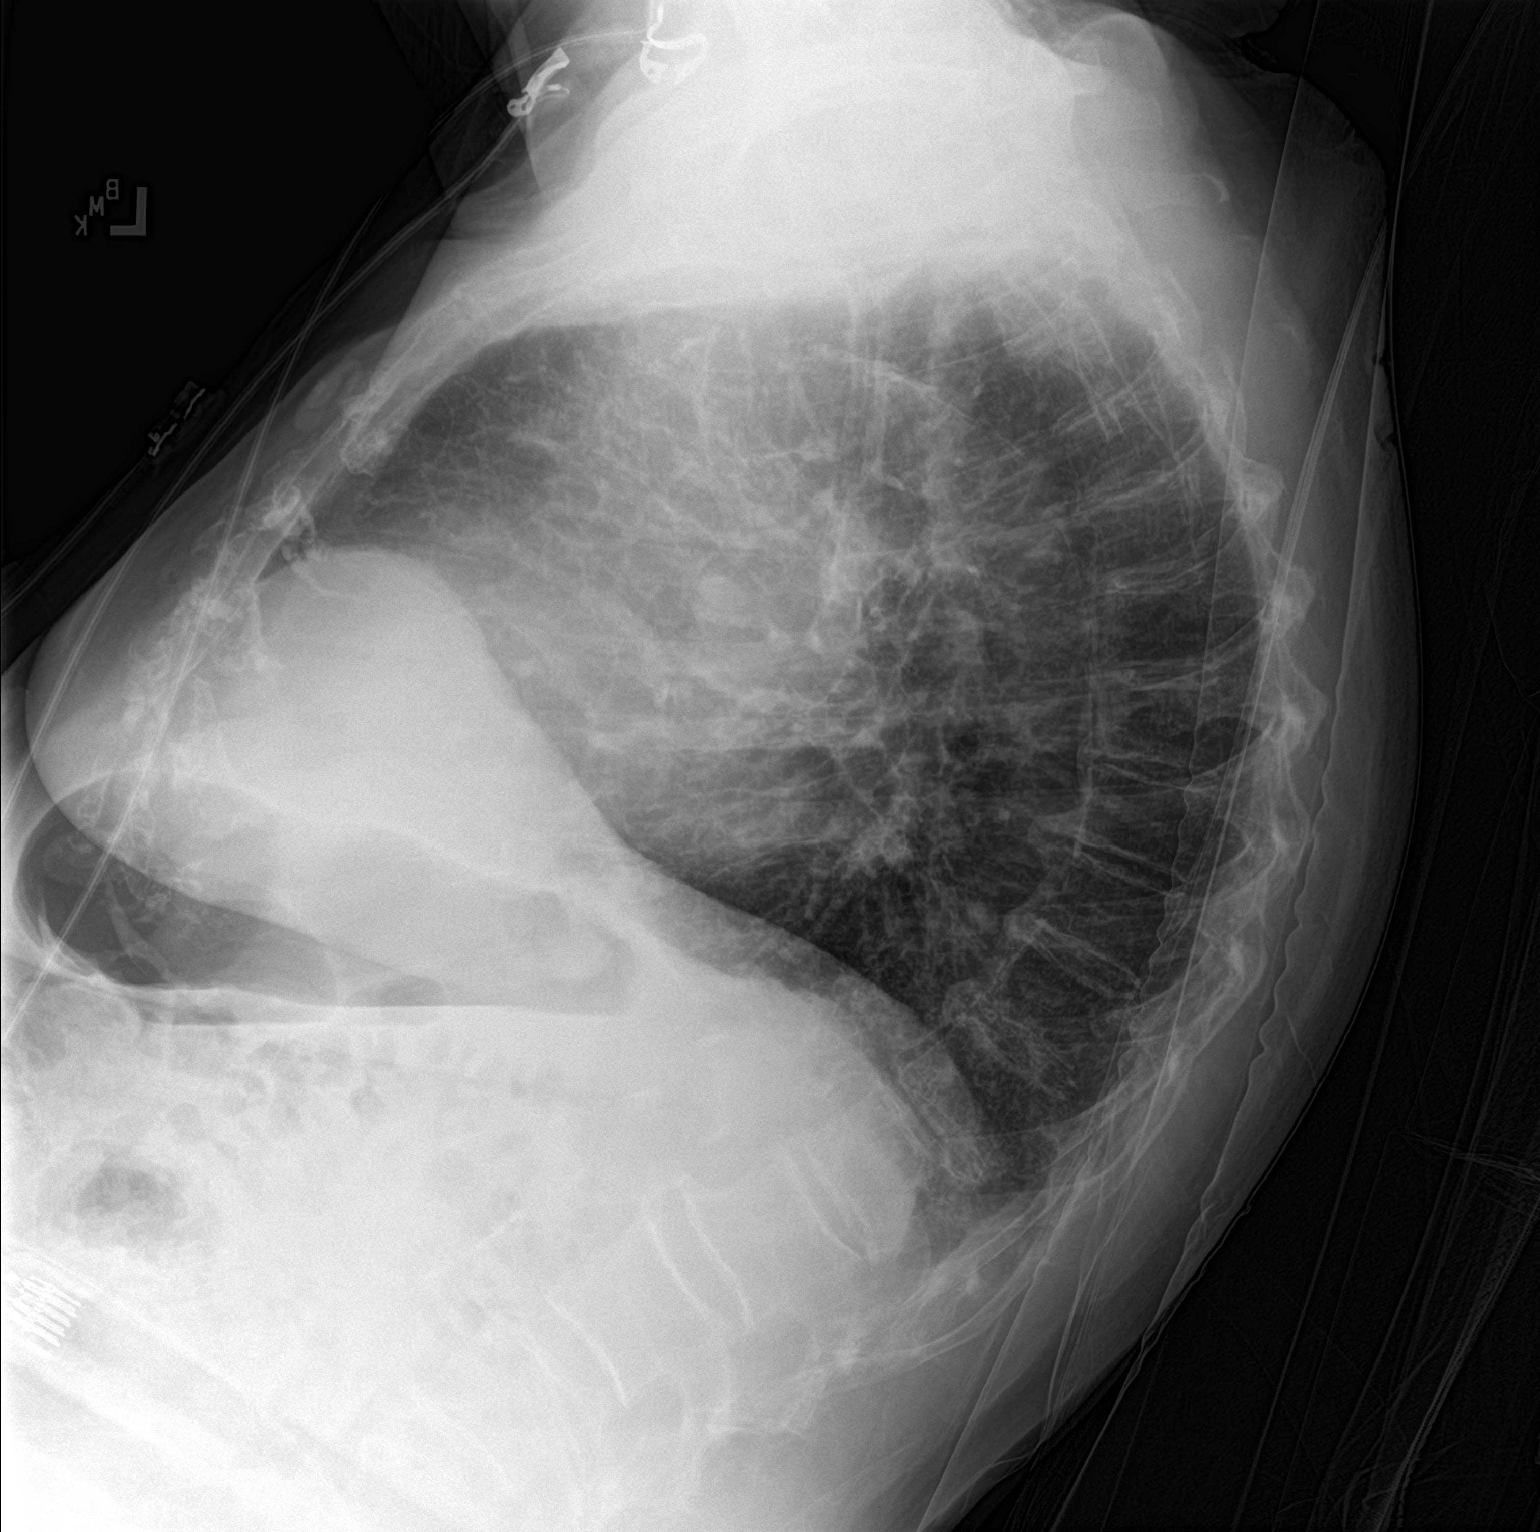
[im 2/2]
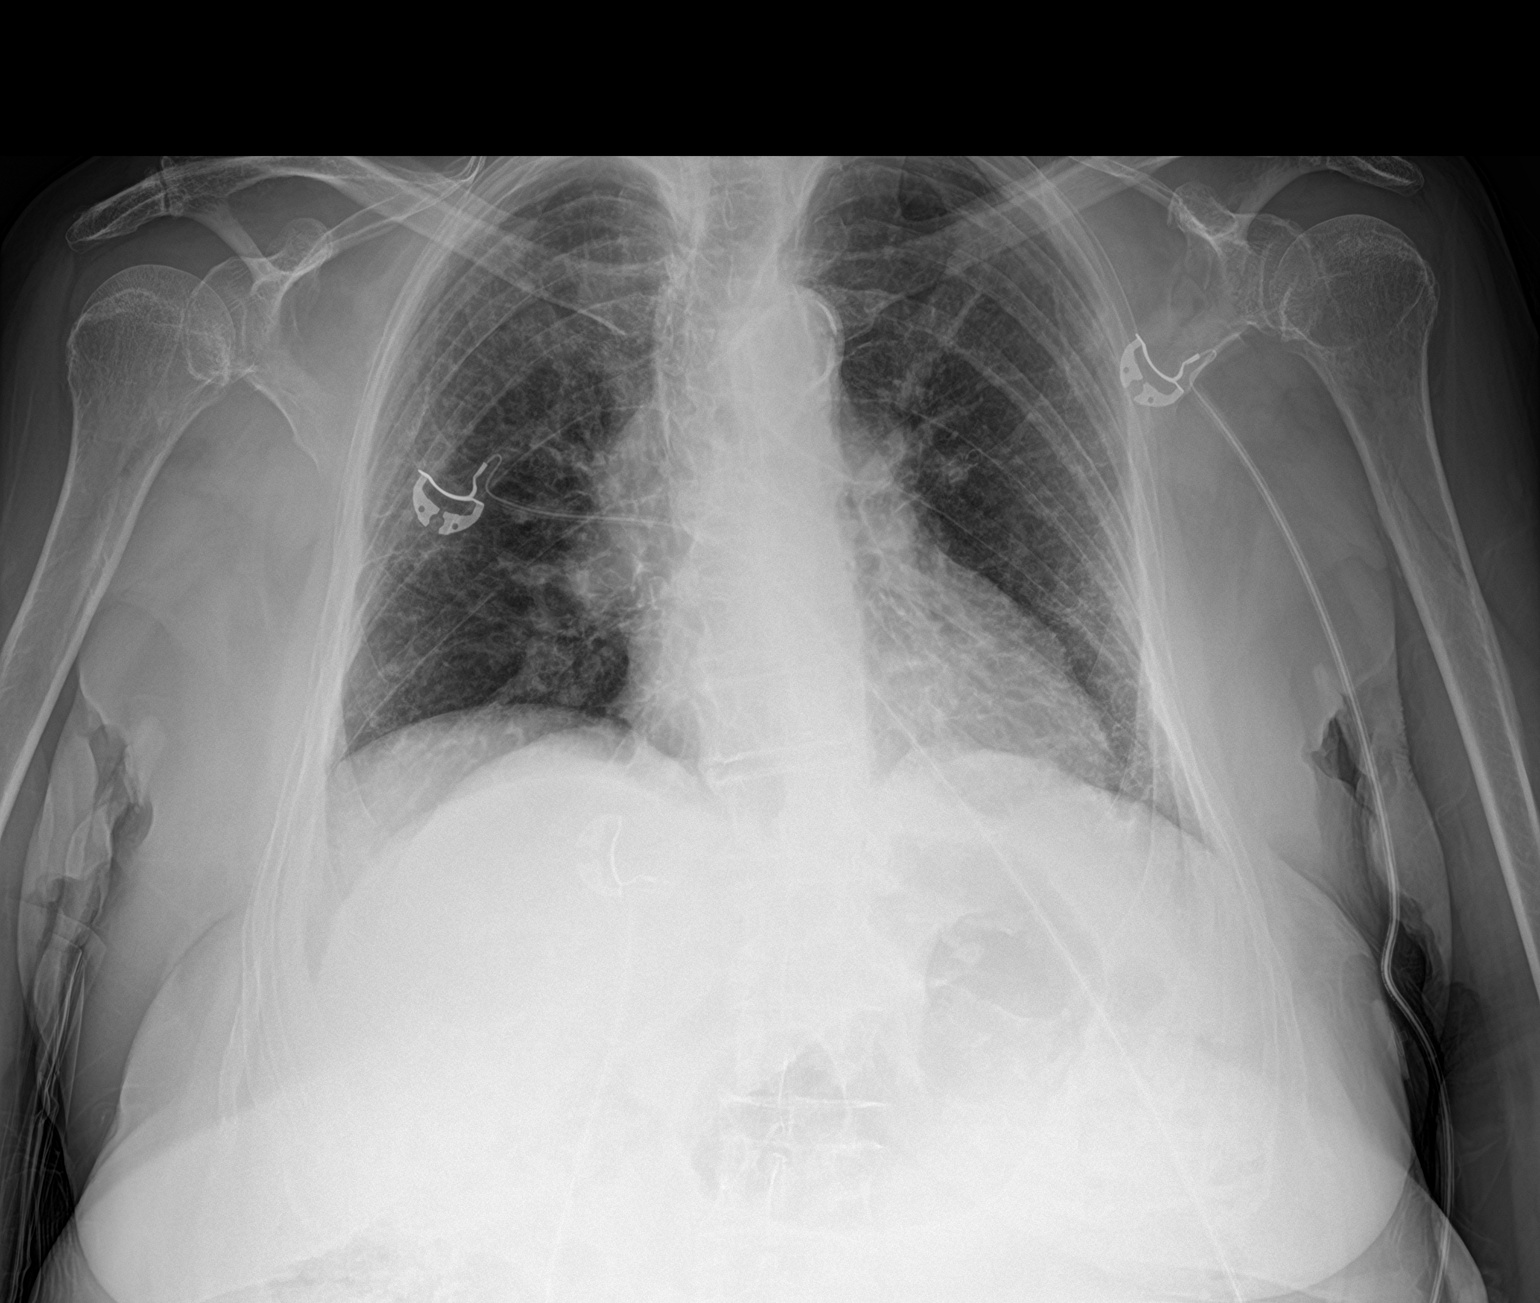

[2 of 2 positions shown; findings below may reference images not displayed]

FINDINGS: Frontal and lateral views of the chest demonstrate an unremarkable
cardiac silhouette. No acute airspace disease, effusion, or
pneumothorax. No acute bony abnormalities.
IMPRESSION: 1. No acute intrathoracic process.

## 2020-05-31 IMAGING — MR MR MRA HEAD W/O CM
1 of 2 series · 20 of 48 positions shown · non-contrast
Comparison: None.

CLINICAL DATA: Code stroke follow-up, abnormal speech

EXAM:
MRA HEAD WITHOUT CONTRAST
TECHNIQUE: Angiographic images of the Circle of [place] were obtained using MRA
technique without intravenous contrast.

[Series 12: TOF · axial · 0.5mm · 0.41mm/px · z∈[-113,-16]mm · 20 of 205 slices shown]
[im 1/205]
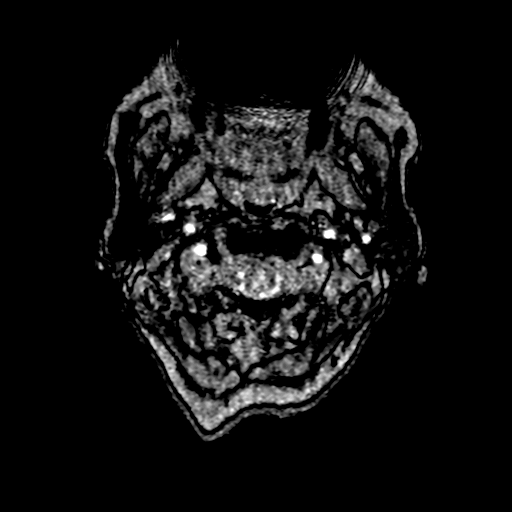
[im 8/205]
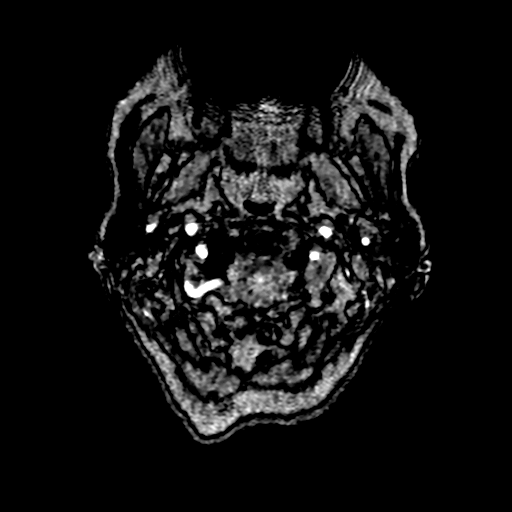
[im 15/205]
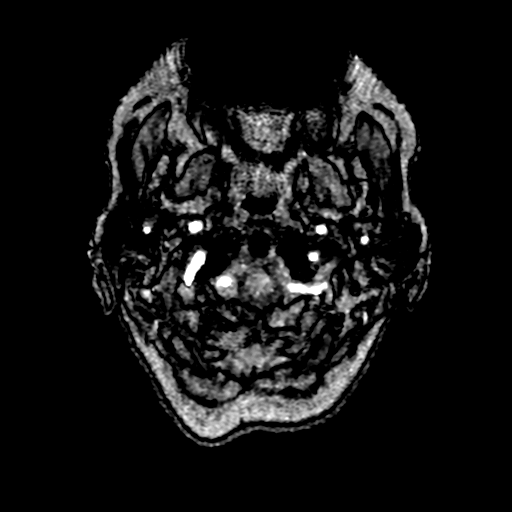
[im 22/205]
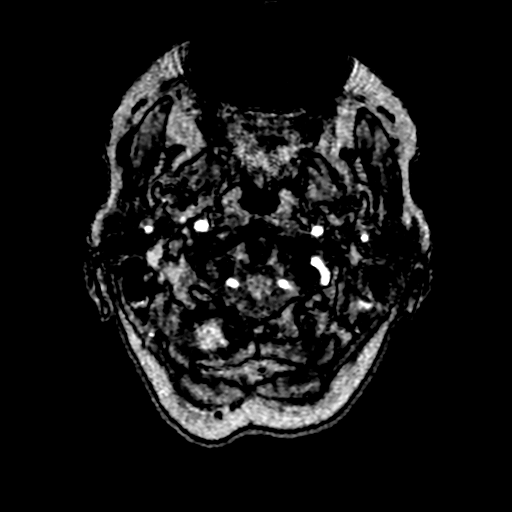
[im 30/205]
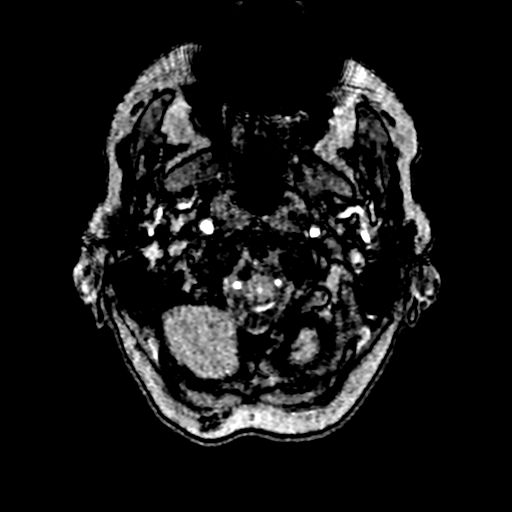
[im 37/205]
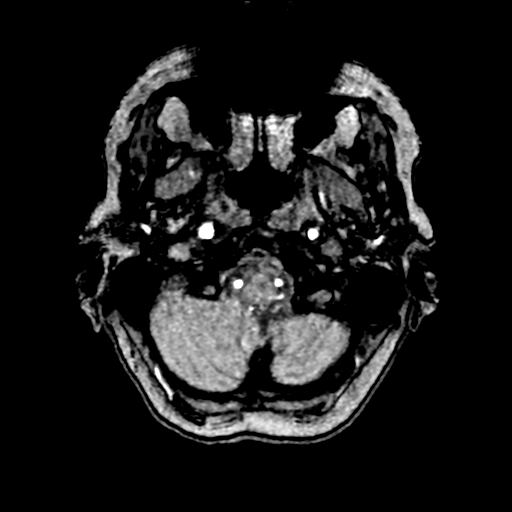
[im 44/205]
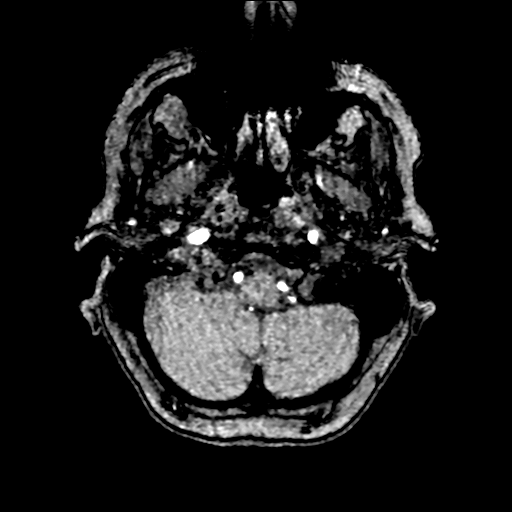
[im 52/205]
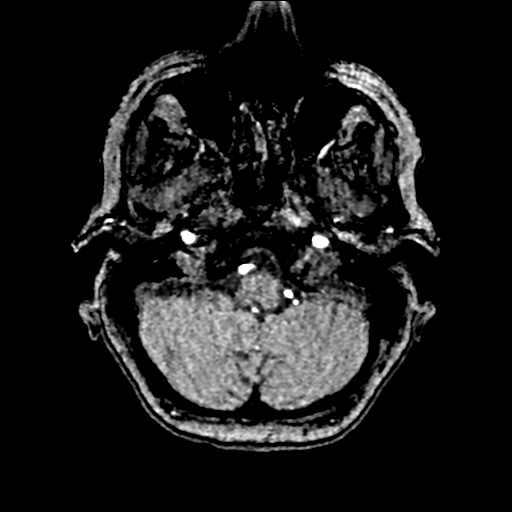
[im 59/205]
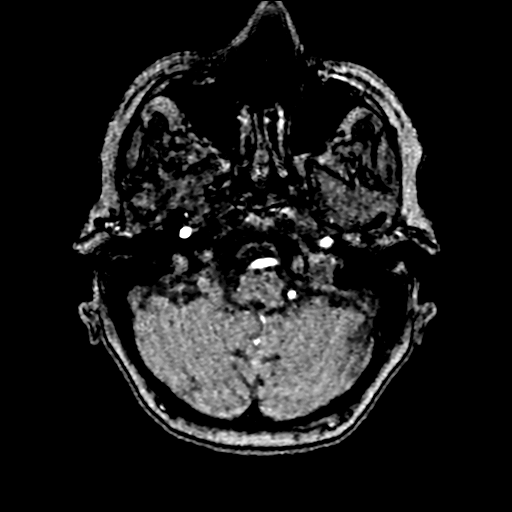
[im 66/205]
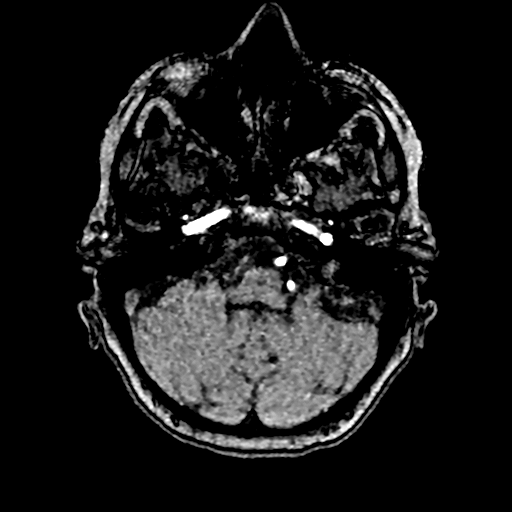
[im 73/205]
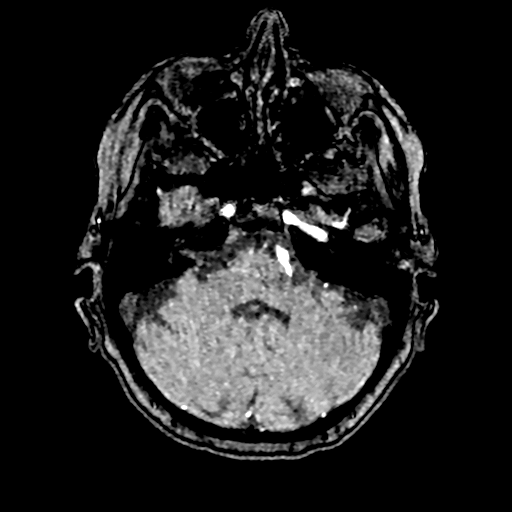
[im 81/205]
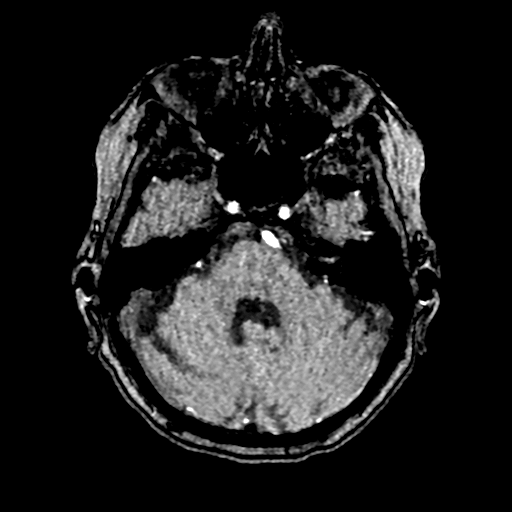
[im 88/205]
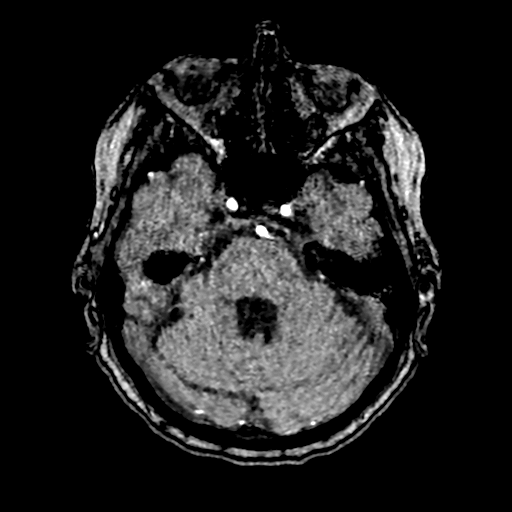
[im 95/205]
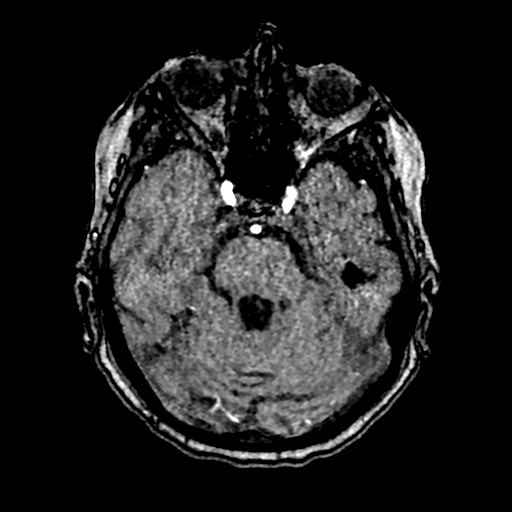
[im 103/205]
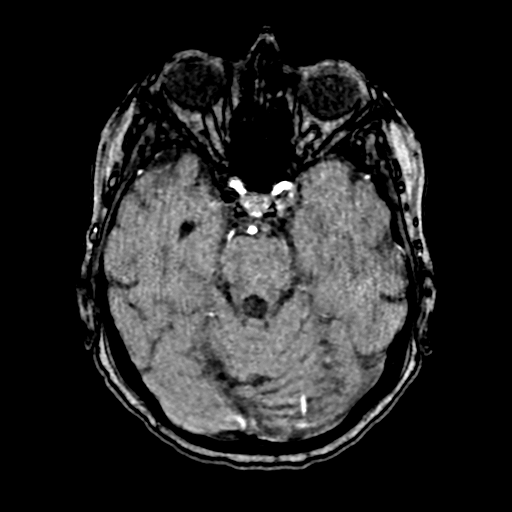
[im 117/205]
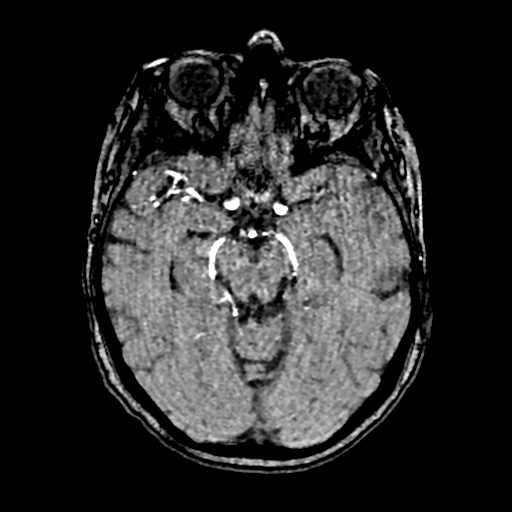
[im 139/205]
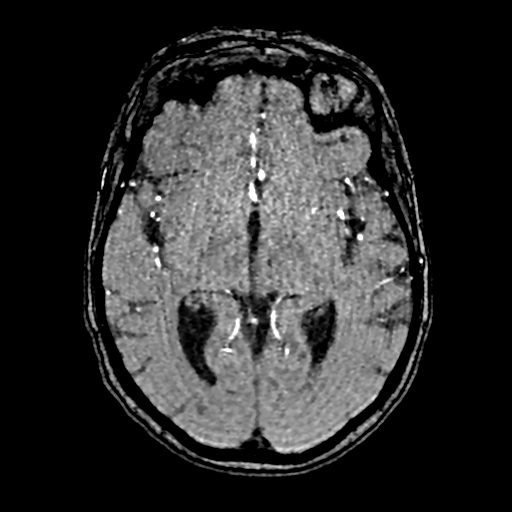
[im 168/205]
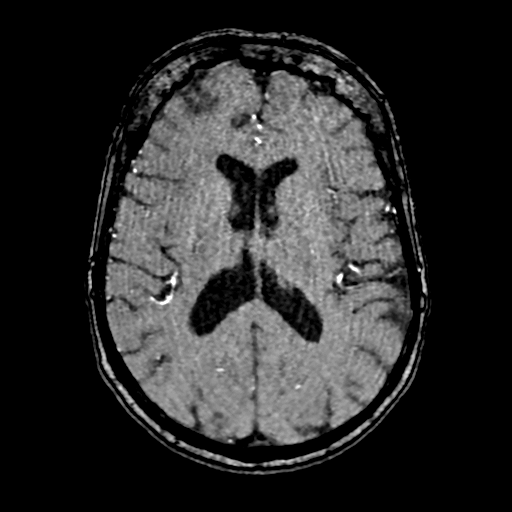
[im 175/205]
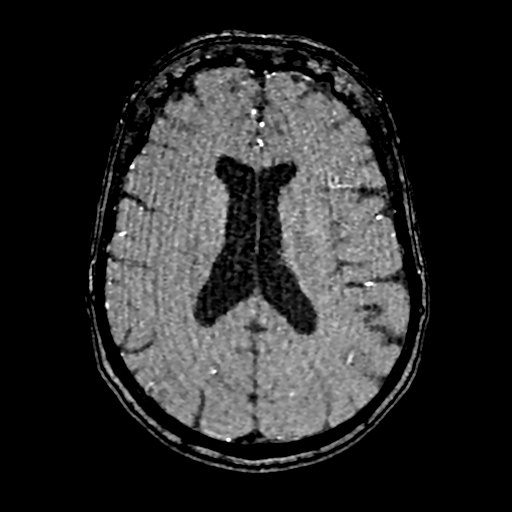
[im 197/205]
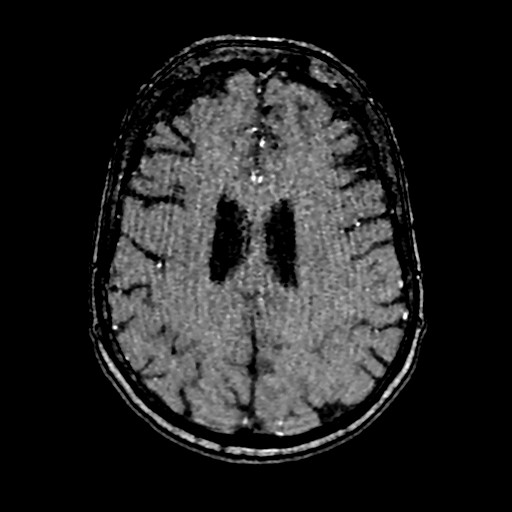

[20 of 48 positions shown; findings below may reference images not displayed]

FINDINGS: Motion artifact is present. Intracranial internal carotid arteries
are patent with atherosclerotic irregularity. Middle and anterior
cerebral arteries are patent. Possible stenosis at right MCA
trifurcation branch origin. Possible bilateral distal MCA branch
atherosclerotic irregularity.

Intracranial vertebral arteries, basilar artery, posterior cerebral
arteries are patent. No aneurysm identified.
IMPRESSION: No proximal intracranial vessel occlusion.

Vessel irregularity likely reflecting combination of intracranial
atherosclerosis and motion degradation.

## 2020-05-31 MED ORDER — PRESERVISION AREDS 2 PO CAPS: ORAL_CAPSULE | Freq: Every day | ORAL | Status: DC

## 2020-05-31 MED ORDER — STROKE: EARLY STAGES OF RECOVERY BOOK: Freq: Once | Status: DC

## 2020-05-31 MED ORDER — FAMOTIDINE 20 MG PO TABS
40.0000 mg | ORAL_TABLET | Freq: Two times a day (BID) | ORAL | Status: DC
  Administered 2020-05-31 – 2020-06-01 (×2): 40 mg via ORAL
  Filled 2020-05-31 (×2): qty 2

## 2020-05-31 MED ORDER — ACETAMINOPHEN 325 MG PO TABS: 650.0000 mg | ORAL_TABLET | ORAL | Status: DC | PRN

## 2020-05-31 MED ORDER — OCUVITE-LUTEIN PO CAPS
1.0000 | ORAL_CAPSULE | Freq: Every day | ORAL | Status: DC
  Administered 2020-06-01: 1 via ORAL
  Filled 2020-05-31: qty 1

## 2020-05-31 MED ORDER — SODIUM CHLORIDE 0.9% FLUSH
3.0000 mL | Freq: Once | INTRAVENOUS | Status: AC
  Administered 2020-05-31: 3 mL via INTRAVENOUS

## 2020-05-31 MED ORDER — ACETAMINOPHEN 160 MG/5ML PO SOLN
650.0000 mg | ORAL | Status: DC | PRN
  Filled 2020-05-31: qty 20.3

## 2020-05-31 MED ORDER — ACETAMINOPHEN 650 MG RE SUPP
650.0000 mg | RECTAL | Status: DC | PRN
Start: 2020-05-31 — End: 2020-06-01

## 2020-05-31 MED ORDER — HEPARIN SODIUM (PORCINE) 5000 UNIT/ML IJ SOLN
5000.0000 [IU] | Freq: Three times a day (TID) | INTRAMUSCULAR | Status: DC
  Administered 2020-05-31 – 2020-06-01 (×2): 5000 [IU] via SUBCUTANEOUS
  Filled 2020-05-31 (×2): qty 1

## 2020-05-31 MED ORDER — VITAMIN B-12 1000 MCG PO TABS
1000.0000 ug | ORAL_TABLET | ORAL | Status: DC
  Administered 2020-06-01: 09:00:00 1000 ug via ORAL
  Filled 2020-05-31 (×2): qty 1

## 2020-05-31 NOTE — ED Notes (Signed)
EEG at bedside.

## 2020-05-31 NOTE — Progress Notes (Signed)
CODE STROKE- PHARMACY COMMUNICATION ° ° °Time CODE STROKE called/page received:1349 ° °Time response to CODE STROKE was made (in person or via phone): 1350 ° °Time Stroke Kit retrieved from [employer] (only if needed):1355 ° °Name of Provider/Nurse contacted:Olivia Broomer ° °A/P: °Patient arrived via EMS with pre-activated Code Stroke. Patient found at home confused, no LOC noted, LKW 1200. CT of head had no acute findings. Was not a tPA candidate given NIHSS of 0. Neurologist plans to check MRI of brain, MRA head and neck for stroke/TIA workup.  ° °Past Medical History:  °Diagnosis Date  °• GERD (gastroesophageal reflux disease)   °• Hiatal hernia   °• Hyperlipidemia   °• Osteoarthritis   ° °Prior to Admission medications   °Medication Sig Start Date End Date Taking? Authorizing Provider  °fexofenadine (ALLEGRA) 180 MG tablet Take 180 mg by mouth daily as needed.     [provider]  °Multiple Vitamins-Minerals (PRESERVISION AREDS 2) CAPS Take by mouth daily.    [provider]  °Poison Ivy Treatments (ZANFEL EX) Apply topically as needed.    [provider]  °vitamin B-12 (CYANOCOBALAMIN) 1000 MCG tablet Take 1,000 mcg by mouth every other day.    [provider]  ° ° °Walid A Nazari ,PharmD °Clinical Pharmacist  °05/31/2020  3:16 PM ° ° °

## 2020-05-31 NOTE — Progress Notes (Signed)
   05/31/20 0200  Clinical Encounter Type  Visited With Patient;Family  Visit Type Initial;Psychological support;Spiritual support;Social support;Code  Referral From Nurse  Consult/Referral To Chaplain   Chaplain responded to a code stroke page. Upon arrival, the medical team was assessing the patient and providing care. Chaplain brought pt's family from the ED entrance to Pt's beside. Chaplain was a calming presence. Chaplain maintained pastoral presence outside of the patient's room. No needs expressed at this time. Notified staff to contact chaplain if needed.

## 2020-05-31 NOTE — Consult Note (Signed)
Stroke Neurology Consultation Note  Consult Requested by: Dr. Tamala Julian  Reason for Consult: code stroke  Consult Date: 05/31/20   The history was obtained from the EMS initially but later with pt daughter.  During history and examination, all items were able to obtain unless otherwise noted.  History of Present Illness:  Sherry Phillips is a 87 y.o. Caucasian female with PMH of HLD, arthritis, HOH send to ED for evaluation.   As per daughter, pt was on the phone with her son at 12pm and she was at her baseline. Around 1:10pm, pt daughter got phone from pt friend stating that pt was on the phone with the friend but had slurry speech and not follow conversations. Daughter then called pt but phone line was busy. Daughter called EMS right away. When daughter got to pt house, EMS was there also. They saw pt with eyes open, but not answering questions, did not follow commands, words coming out with no sense. No LOC, staring off, shaking jerking reported. No unilateral weakness seen. Pt sent to ER for evaluation. When I asked pt, she said she was in a nap and then all of sudden many people in her house. She initially said she can not remember what happened but when her daughter asked her she said she remembered everything and she thought they were making joke with her.   In ER, pt was high 207/80, now down to 187/83, and glucose 67. CT head no acute finding. Her neuro exam was normal except HOH.   LSN: 12pm tPA Given: No: NIHSS = 0  Past Medical History:  Diagnosis Date  . GERD (gastroesophageal reflux disease)   . Hiatal hernia   . Hyperlipidemia   . Osteoarthritis     Past Surgical History:  Procedure Laterality Date  . APPENDECTOMY  1962   with ruptured ovary repair  . BREAST BIOPSY  1970  . CATARACT EXTRACTION     both eyes  . ROBOTIC ASSISTED LAPAROSCOPIC REPAIR OF PARAESOPHAGEAL HERNIA  01/2017   Dr Grandville Silos at [facility] History  Problem Relation Age of Onset  .  Hypertension Mother   . Leukemia Mother   . COPD Father   . Diabetes Sister        borderline  . Coronary artery disease Brother   . Lung disease Brother   . Cancer Neg Hx        no breast or colon cancer    Social History:  reports that she has never smoked. She has never used smokeless tobacco. She reports that she does not drink alcohol and does not use drugs.  Allergies:  Allergies  Allergen Reactions  . Egg White [Albumen, Egg] Rash    No current facility-administered medications on file prior to encounter.   Current Outpatient Medications on File Prior to Encounter  Medication Sig Dispense Refill  . fexofenadine (ALLEGRA) 180 MG tablet Take 180 mg by mouth daily as needed.     . Multiple Vitamins-Minerals (PRESERVISION AREDS 2) CAPS Take by mouth daily.    . Poison Ivy Treatments (ZANFEL EX) Apply topically as needed.    . vitamin B-12 (CYANOCOBALAMIN) 1000 MCG tablet Take 1,000 mcg by mouth every other day.      Review of Systems: A full ROS was attempted today and was able to be performed.  Systems assessed include - Constitutional, Eyes, HENT, Respiratory, Cardiovascular, Gastrointestinal, Genitourinary, Integument/breast, Hematologic/lymphatic, Musculoskeletal, Neurological, Behavioral/Psych, Endocrine, Allergic/Immunologic - with pertinent responses as per HPI.  Physical Examination: Temp:  [98 F (36.7 C)] 98 F (36.7 C) (03/07 1430) Pulse Rate:  [97] 97 (03/07 1428) Resp:  [18] 18 (03/07 1428) BP: (207)/(80) 207/80 (03/07 1428) SpO2:  [100 %] 100 % (03/07 1428) Weight:  [59 kg] 59 kg (03/07 1426)  General - well nourished, well developed, in no apparent distress.    Ophthalmologic - fundi not visualized due to noncooperation.    Cardiovascular - regular rhythm and rate  Mental Status -  Level of arousal and orientation to time, place, and person were intact. Language including expression, naming, repetition, comprehension was assessed and found  intact.  Cranial Nerves II - XII - II - Vision intact OU. III, IV, VI - Extraocular movements intact. V - Facial sensation intact bilaterally. VII - Facial movement intact bilaterally. VIII - Hearing & vestibular intact bilaterally. X - Palate elevates symmetrically. XI - Chin turning & shoulder shrug intact bilaterally. XII - Tongue protrusion intact.  Motor Strength - The patient's strength was normal in all extremities and pronator drift was absent.   Motor Tone & Bulk - Muscle tone was assessed at the neck and appendages and was normal.  Bulk was normal and fasciculations were absent.   Reflexes - The patient's reflexes were normal in all extremities and she had no pathological reflexes.  Sensory - Light touch, temperature/pinprick were assessed and were normal.    Coordination - The patient had normal movements in the hands with no ataxia or dysmetria.  Tremor was absent.  Gait and Station - deferred  Data Reviewed: CT HEAD CODE STROKE WO CONTRAST  Result Date: 05/31/2020 CLINICAL DATA:  Code stroke. Slurred speech and confusion with resolution. EXAM: CT HEAD WITHOUT CONTRAST TECHNIQUE: Contiguous axial images were obtained from the base of the skull through the vertex without intravenous contrast. COMPARISON:  None. FINDINGS: Brain: No focal abnormality affects the brainstem or cerebellum. Cerebral hemispheres show mild chronic small-vessel ischemic change of the white matter. Punctate focal calcification at the right parietal vertex region could be a benign parenchymal calcification or a small bit of embolized calcific plaque. No evidence of acute infarction in the region. No mass, hemorrhage, hydrocephalus or extra-axial collection. Vascular: There is atherosclerotic calcification of the major vessels at the base of the brain. Skull: Negative Sinuses/Orbits: Clear/normal Other: None ASPECTS ([place] Stroke Program Early CT Score) - Ganglionic level infarction (caudate, lentiform  nuclei, internal capsule, insula, M1-M3 cortex): 7 - Supraganglionic infarction (M4-M6 cortex): 3 Total score (0-10 with 10 being normal): 10 IMPRESSION: 1. No acute finding by CT. Mild chronic small-vessel ischemic change of the cerebral hemispheric white matter. Punctate focal calcification at the right parietal vertex region could be a benign parenchymal calcification or a small bit of embolized calcific plaque. No evidence of acute stroke associated with that however. 2. ASPECTS is 10. Electronically Signed   By: Nelson Chimes M.D.   On: 05/31/2020 14:23    Assessment: 87 y.o. female with PMH of HLD, arthritis, HOH found by family at home confused, not answering questions, did not follow commands, words coming out with no sense. No LOC, staring off, shaking jerking reported. In ER, initially she seemed to be overwhelmed and agitated, not cooperative with exam but after CT her neuro exam was normal with NIHSS = 0. CT head no acute finding. In ER, pt was high 207/80, now down to 187/83, and glucose 67. Pt not tPA candidate given NIHSS = 0. Etiology for pt symptoms not quite sure, DDx including  TIA, seizure with post ictal agitation confusion, hypertensive encephalopathy, and hypoglycemic event. Will give her orange juice and cracker and repeat CBG. Will check MRI brain, MRA head and neck for stroke/TIA work up, will do EEG to rule out seizure.   Plan: - Recommend overnight observation to finish off work up - Continue further stroke/TIA/seizure work up  - Frequent neuro checks - Telemetry monitoring - MRI brain and MRA head and neck - Echocardiogram  - Fasting lipid panel and HgbA1C - Permissive hypertension (only treat if BP > 220/120 unless a lower blood pressure is clinically necessary) for 24-48 hours post stroke onset - GI and DVT prophylaxis  - Recommend ASA 81 at the meantime - Orange juice and cracker stat with repeat CBG in 63min - Discussed with Dr. Tamala Julian ED physician - Will  follow   Thank you for this consultation and allowing [place] to participate in the care of this patient.   Rosalin Hawking, MD PhD Stroke Neurology 05/31/2020 3:00 PM

## 2020-05-31 NOTE — ED Triage Notes (Signed)
Patient arrives via EMS for slurred speech. Patient was at home talking on phone with family around 1200 when this occurred. No other deficients. Code stroke called by EMS.

## 2020-05-31 NOTE — ED Notes (Signed)
Cbg 67

## 2020-05-31 NOTE — ED Provider Notes (Signed)
[facility] Emergency Department Provider Note ____________________________________________   Event Date/Time   First MD Initiated Contact with Patient 05/31/20 1404     (approximate)  I have reviewed the triage vital signs and the nursing notes.  HISTORY  Chief Complaint Code Stroke   HPI Sherry Phillips is a 87 y.o. femalewho presents to the ED for evaluation of possible stroke.  Chart review indicates history of GERD and HLD.  Patient was reportedly on the telephone with her son at about noon today and at her baseline.  An hour later, patient's daughter received a phone call from a family friend indicating that patient was not making any sense and had slurred speech over the telephone.  Daughter therefore called EMS and went straight to the house, finding her with eyes open, a phone to her ear that was not connected in her "not making any sense."  Here in the ED, after daughter arrives later, she reports that patient has returned to baseline.  Patient reports that she does not know what happened and has no recollection of this transient episode of confusion.   Past Medical History:  Diagnosis Date  . GERD (gastroesophageal reflux disease)   . Hiatal hernia   . Hyperlipidemia   . Osteoarthritis     Patient Active Problem List   Diagnosis Date Noted  . Skin lesion of chest wall 10/06/2019  . Preventative health care 05/07/2019  . Macular degeneration, wet ([place]) 05/07/2019  . Advance directive discussed with patient 05/07/2019  . Allergic rhinitis due to pollen 05/21/2015  . Vitamin B 12 deficiency 05/21/2015  . Fatigue 11/23/2014  . HYPERLIPIDEMIA 04/13/2010  . GERD 04/13/2010  . OSTEOARTHRITIS 04/13/2010    Past Surgical History:  Procedure Laterality Date  . APPENDECTOMY  1962   with ruptured ovary repair  . BREAST BIOPSY  1970  . CATARACT EXTRACTION     both eyes  . ROBOTIC ASSISTED LAPAROSCOPIC REPAIR OF PARAESOPHAGEAL HERNIA   01/2017   Dr Grandville Silos at [facility]    Prior to Admission medications   Medication Sig Start Date End Date Taking? Authorizing Provider  fexofenadine (ALLEGRA) 180 MG tablet Take 180 mg by mouth daily as needed.     [provider]  Multiple Vitamins-Minerals (PRESERVISION AREDS 2) CAPS Take by mouth daily.    [provider]  Poison Ivy Treatments (ZANFEL EX) Apply topically as needed.    [provider]  vitamin B-12 (CYANOCOBALAMIN) 1000 MCG tablet Take 1,000 mcg by mouth every other day.    [provider]    Allergies Egg white [albumen, egg]  Family History  Problem Relation Age of Onset  . Hypertension Mother   . Leukemia Mother   . COPD Father   . Diabetes Sister        borderline  . Coronary artery disease Brother   . Lung disease Brother   . Cancer Neg Hx        no breast or colon cancer    Social History Social History   Tobacco Use  . Smoking status: Never Smoker  . Smokeless tobacco: Never Used  Vaping Use  . Vaping Use: Never used  Substance Use Topics  . Alcohol use: No  . Drug use: No    Review of Systems  Unable to be accurately assessed due to patient's transient confusion and amnesia. ____________________________________________   PHYSICAL EXAM:  VITAL SIGNS: Vitals:   05/31/20 1430 05/31/20 1430  BP: (!) 187/83  Pulse: 95   Resp: (!) 25   Temp:  98 F (36.7 C)  SpO2: 100%      Constitutional: Alert and oriented. Well appearing and in no acute distress. Eyes: Conjunctivae are normal. PERRL. EOMI. Head: Atraumatic. Nose: No congestion/rhinnorhea. Mouth/Throat: Mucous membranes are moist.  Oropharynx non-erythematous. Neck: No stridor. No cervical spine tenderness to palpation. Cardiovascular: Normal rate, regular rhythm. Grossly normal heart sounds.  Good peripheral circulation. Respiratory: Normal respiratory effort.  No retractions. Lungs CTAB. Gastrointestinal: Soft , nondistended, nontender to  palpation. No CVA tenderness. Musculoskeletal: No lower extremity tenderness nor edema.  No joint effusions. No signs of acute trauma. Neurologic:  Normal speech and language. No gross focal neurologic deficits are appreciated.  Cranial nerves II through XII intact 5/5 strength and sensation in all 4 extremities Skin:  Skin is warm, dry and intact. No rash noted. Psychiatric: Mood and affect are normal. Speech and behavior are normal.  ____________________________________________   LABS (all labs ordered are listed, but only abnormal results are displayed)  Labs Reviewed  CBC - Abnormal; Notable for the following components:      Result Value   WBC 10.9 (*)    All other components within normal limits  DIFFERENTIAL - Abnormal; Notable for the following components:   Monocytes Absolute 1.1 (*)    All other components within normal limits  COMPREHENSIVE METABOLIC PANEL - Abnormal; Notable for the following components:   Potassium 3.2 (*)    Glucose, Bld 115 (*)    Creatinine, Ser 1.09 (*)    Calcium 8.8 (*)    GFR, Estimated 50 (*)    All other components within normal limits  PROTIME-INR  APTT  I-STAT CREATININE, ED  CBG MONITORING, ED   ____________________________________________  12 Lead EKG  Sinus rhythm, rate of 97 bpm.  Normal axis.  Normal intervals.  No evidence of acute ischemia. ____________________________________________  RADIOLOGY  ED MD interpretation: CT head reviewed by me without evidence of acute intracranial pathology.  Official radiology report(s): CT HEAD CODE STROKE WO CONTRAST  Result Date: 05/31/2020 CLINICAL DATA:  Code stroke. Slurred speech and confusion with resolution. EXAM: CT HEAD WITHOUT CONTRAST TECHNIQUE: Contiguous axial images were obtained from the base of the skull through the vertex without intravenous contrast. COMPARISON:  None. FINDINGS: Brain: No focal abnormality affects the brainstem or cerebellum. Cerebral hemispheres show  mild chronic small-vessel ischemic change of the white matter. Punctate focal calcification at the right parietal vertex region could be a benign parenchymal calcification or a small bit of embolized calcific plaque. No evidence of acute infarction in the region. No mass, hemorrhage, hydrocephalus or extra-axial collection. Vascular: There is atherosclerotic calcification of the major vessels at the base of the brain. Skull: Negative Sinuses/Orbits: Clear/normal Other: None ASPECTS ([place] Stroke Program Early CT Score) - Ganglionic level infarction (caudate, lentiform nuclei, internal capsule, insula, M1-M3 cortex): 7 - Supraganglionic infarction (M4-M6 cortex): 3 Total score (0-10 with 10 being normal): 10 IMPRESSION: 1. No acute finding by CT. Mild chronic small-vessel ischemic change of the cerebral hemispheric white matter. Punctate focal calcification at the right parietal vertex region could be a benign parenchymal calcification or a small bit of embolized calcific plaque. No evidence of acute stroke associated with that however. 2. ASPECTS is 10. Electronically Signed   By: Nelson Chimes M.D.   On: 05/31/2020 14:23    ____________________________________________   PROCEDURES and INTERVENTIONS  Procedure(s) performed (including Critical Care):  .1-3 Lead EKG Interpretation Performed by: Tamala Julian,  Camillia Herter, MD Authorized by: Vladimir Crofts, MD     Interpretation: normal     ECG rate:  90   ECG rate assessment: normal     Rhythm: sinus rhythm     Ectopy: none     Conduction: normal      Medications  sodium chloride flush (NS) 0.9 % injection 3 mL (3 mLs Intravenous Given 05/31/20 1433)    ____________________________________________   MDM / ED COURSE   87 year old woman with hyperlipidemia presents to the ED after transient episode of slurred speech confusion, concerning for TIA, and requiring medical observation admission.  Slightly hypertensive, otherwise normal vitals on room air.  Exam  with no neurovascular deficits, slurred speech, confusion or any acute derangements here in the ED.  She looks well and does not recall her transient episode of confusion and slurred speech from earlier today.  She presents as a stroke alert, and has a reassuring CT head without contrast.  Blood work is unremarkable.  EKG is nonischemic.  Pending MRI as the time of admission.  We will discuss the case with hospitalist medicine for evaluation of stroke risk from a likely TIA, possibly postictal from an unwitnessed seizure, and possibly behavioral.    ____________________________________________   FINAL CLINICAL IMPRESSION(S) / ED DIAGNOSES  Final diagnoses:  TIA (transient ischemic attack)  Slurred speech  Confusion     ED Discharge Orders    None       Emmalee Solivan   Note:  This document was prepared using Set designer software and may include unintentional dictation errors.   Vladimir Crofts, MD 05/31/20 1534

## 2020-05-31 NOTE — ED Notes (Signed)
Pt taken to CT by EMS with Minette Brine, RN.

## 2020-05-31 NOTE — ED Notes (Signed)
Patient to xray at this time

## 2020-05-31 NOTE — ED Notes (Signed)
Patient given 8 oz of orange juice and crackers for blood sugar per Erlinda Hong, MD.

## 2020-05-31 NOTE — Code Documentation (Signed)
PT arrives via EMS pre-activated code Stroke- states at 12 PM pt spoke to family and was WNL and then shortly after pt spoke to another family member and was confused and speech did not make sense, family called EMS, EMS reports FAST-ED of 2, Dr. Erlinda Hong, pharmacist, waiting for pt, initial NIHSS 0, pt awake and alert continues to say "I dont know how I got here" AO X4, pt taken straight to CT, after CT pt taken to room 5, family at bedside, report off to [employer]

## 2020-05-31 NOTE — H&P (Signed)
History and Physical    Sherry Phillips [id number]:[id number] DOB: [birth year] DOA: 05/31/2020  PCP: [name], MD  Patient coming from: home I have personally briefly reviewed patient's old medical records in [place]  Chief Complaint: slurred speech/confusion  HPI: Sherry Phillips is a 87 y.o. female with medical history significant of HLD,GERD, OA, B12 def, ocular migraines, who presents to ed BIB EMS with concern for CVA. Per notes patient was on the phone with her son at around [place] and was noted to be at her baseline. At around 1.10 patient daughter received call from patient friend noting that while patient was on the phone with her friend she was noted to have jumbles speech and to be somewhat confused and not able to follow the conversation. At that time EMS was called. ON arrival to home EMS found patient to be confused not able to answer questions and did not follow commands with word salad. Also noted per ems that patient was staring off and also had jerking movements, however no focal weakness was noted. Patient at this time notes no complaints and per daughter patient is back to baseline.    ED Course:  Afeb, bp207/80, 187/83, hr:93, rr25, sat 100% on ra  In ed patient had NIH of 0 and was back to baseline, CT head, MRI head negative, MRA  Head noted No proximal intracranial vessel occlusion. MRA neck, negative  EKG:nsr no st-twave changes Labs: Wbc:10.9,plt271, diff wnl NA139, K3.2, cl 104, cr 1.09 at baseline ,  neurology rec:   Recommend overnight observation to finish off work up  Continue further stroke/TIA/seizure work up   Frequent neuro checks  Telemetry monitoring  Echocardiogram   Fasting lipid panel and HgbA1C  Permissive hypertension (only treat if BP > 220/120 unless a lower blood pressure is clinically necessary) for 24-48 hours post stroke onset  GI and DVT prophylaxis   Recommend ASA 81 at the meantime  Review of Systems: As  per HPI otherwise 10 point review of systems negative.   Past Medical History:  Diagnosis Date  . GERD (gastroesophageal reflux disease)   . Hiatal hernia   . Hyperlipidemia   . Osteoarthritis     Past Surgical History:  Procedure Laterality Date  . APPENDECTOMY  1962   with ruptured ovary repair  . BREAST BIOPSY  1970  . CATARACT EXTRACTION     both eyes  . ROBOTIC ASSISTED LAPAROSCOPIC REPAIR OF PARAESOPHAGEAL HERNIA  01/2017   Dr Grandville Silos at [facility]     reports that she has never smoked. She has never used smokeless tobacco. She reports that she does not drink alcohol and does not use drugs.  Allergies  Allergen Reactions  . Egg Donia Pounds, Egg] Rash    Family History  Problem Relation Age of Onset  . Hypertension Mother   . Leukemia Mother   . COPD Father   . Diabetes Sister        borderline  . Coronary artery disease Brother   . Lung disease Brother   . Cancer Neg Hx        no breast or colon cancer    Prior to Admission medications   Medication Sig Start Date End Date Taking? Authorizing Provider  fexofenadine (ALLEGRA) 180 MG tablet Take 180 mg by mouth daily as needed.    Yes [provider]  Multiple Vitamins-Minerals (PRESERVISION AREDS 2) CAPS Take by mouth daily.   Yes [provider]  ranitidine (ZANTAC) 150  MG capsule Take 150 mg by mouth 2 (two) times daily.   Yes [provider]  vitamin B-12 (CYANOCOBALAMIN) 1000 MCG tablet Take 1,000 mcg by mouth every other day.   Yes [provider]  Poison Ivy Treatments (ZANFEL EX) Apply topically as needed.    [provider]    Physical Exam: Vitals:   05/31/20 1430 05/31/20 1430 05/31/20 1600 05/31/20 1630  BP: (!) 187/83  (!) 162/134 (!) 167/65  Pulse: 95  79 81  Resp: (!) 25  17 15   Temp:  98 F (36.7 C)    TempSrc:  Oral    SpO2: 100%  100% 100%  Weight:      Height:         Vitals:   05/31/20 1430 05/31/20 1430 05/31/20 1600 05/31/20 1630  BP:  (!) 187/83  (!) 162/134 (!) 167/65  Pulse: 95  79 81  Resp: (!) 25  17 15   Temp:  98 F (36.7 C)    TempSrc:  Oral    SpO2: 100%  100% 100%  Weight:      Height:      Constitutional: NAD, calm, comfortable Eyes: PERRL, lids and conjunctivae normal ENMT: Mucous membranes are moist. Posterior pharynx clear of any exudate or lesions.Normal dentition.  Neck: normal, supple, no masses, no thyromegaly Respiratory: clear to auscultation bilaterally, no wheezing, no crackles. Normal respiratory effort. No accessory muscle use.  Cardiovascular: Regular rate and rhythm, no murmurs / rubs / gallops. No extremity edema. 2+ pedal pulses. No carotid bruits.  Abdomen: no tenderness, no masses palpated. No hepatosplenomegaly. Bowel sounds positive.  Musculoskeletal: no clubbing / cyanosis. No joint deformity upper and lower extremities. Good ROM, no contractures. Normal muscle tone.  Skin: no rashes, lesions, ulcers. No induration Neurologic: CN 2-12 grossly intact. Sensation intact, DTR normal. Strength 5/5 in all 4.  Psychiatric: Normal judgment and insight. Alert and oriented x 3. Normal mood.    Labs on Admission: I have personally reviewed following labs and imaging studies  CBC: Recent Labs  Lab 05/31/20 1403  WBC 10.9*  NEUTROABS 6.0  HGB 14.2  HCT 43.1  MCV 91.1  PLT 443   Basic Metabolic Panel: Recent Labs  Lab 05/31/20 1403  NA 139  K 3.2*  CL 104  CO2 25  GLUCOSE 115*  BUN 14  CREATININE 1.09*  CALCIUM 8.8*   GFR: Estimated Creatinine Clearance: 30.9 mL/min (A) (by C-G formula based on SCr of 1.09 mg/dL (H)). Liver Function Tests: Recent Labs  Lab 05/31/20 1403  AST 17  ALT 10  ALKPHOS 75  BILITOT 0.8  PROT 6.8  ALBUMIN 3.9   No results for input(s): LIPASE, AMYLASE in the last 168 hours. No results for input(s): AMMONIA in the last 168 hours. Coagulation Profile: Recent Labs  Lab 05/31/20 1403  INR 0.9   Cardiac Enzymes: No results for input(s):  CKTOTAL, CKMB, CKMBINDEX, TROPONINI in the last 168 hours. BNP (last 3 results) No results for input(s): PROBNP in the last 8760 hours. HbA1C: No results for input(s): HGBA1C in the last 72 hours. CBG: Recent Labs  Lab 05/31/20 1556  GLUCAP 122*   Lipid Profile: No results for input(s): CHOL, HDL, LDLCALC, TRIG, CHOLHDL, LDLDIRECT in the last 72 hours. Thyroid Function Tests: No results for input(s): TSH, T4TOTAL, FREET4, T3FREE, THYROIDAB in the last 72 hours. Anemia Panel: No results for input(s): VITAMINB12, FOLATE, FERRITIN, TIBC, IRON, RETICCTPCT in the last 72 hours. Urine analysis:  Component Value Date/Time   BILIRUBINUR negative 05/14/2017 1445   PROTEINUR negative 05/14/2017 1445   UROBILINOGEN 0.2 05/14/2017 1445   NITRITE negative 05/14/2017 1445   LEUKOCYTESUR Moderate (2+) (A) 05/14/2017 1445    Radiological Exams on Admission: MR ANGIO HEAD WO CONTRAST  Result Date: 05/31/2020 CLINICAL DATA:  Code stroke follow-up, abnormal speech EXAM: MRA HEAD WITHOUT CONTRAST TECHNIQUE: Angiographic images of the Circle of Willis were obtained using MRA technique without intravenous contrast. COMPARISON:  None. FINDINGS: Motion artifact is present. Intracranial internal carotid arteries are patent with atherosclerotic irregularity. Middle and anterior cerebral arteries are patent. Possible stenosis at right MCA trifurcation branch origin. Possible bilateral distal MCA branch atherosclerotic irregularity. Intracranial vertebral arteries, basilar artery, posterior cerebral arteries are patent. No aneurysm identified. IMPRESSION: No proximal intracranial vessel occlusion. Vessel irregularity likely reflecting combination of intracranial atherosclerosis and motion degradation. Electronically Signed   By: Macy Mis M.D.   On: 05/31/2020 16:06   MR ANGIO NECK WO CONTRAST  Result Date: 05/31/2020 CLINICAL DATA:  Stroke.  Slurred speech. EXAM: MRA NECK WITHOUT CONTRAST TECHNIQUE:  Angiographic images of the neck were obtained using MRA technique without intravenous contrast. Carotid stenosis measurements (when applicable) are obtained utilizing NASCET criteria, using the distal internal carotid diameter as the denominator. COMPARISON:  MRI head 05/31/2020 FINDINGS: Antegrade flow in the carotid and vertebral arteries bilaterally. Both carotids are widely patent without stenosis. Carotid bifurcation negative. Both vertebral arteries widely patent. IMPRESSION: Negative MRA neck Electronically Signed   By: Franchot Gallo M.D.   On: 05/31/2020 15:45   MR BRAIN WO CONTRAST  Result Date: 05/31/2020 CLINICAL DATA:  Stroke.  Slurred speech EXAM: MRI HEAD WITHOUT CONTRAST TECHNIQUE: Multiplanar, multiecho pulse sequences of the brain and surrounding structures were obtained without intravenous contrast. COMPARISON:  CT head 05/31/2020 FINDINGS: Brain: Negative for acute infarct. Minimal white matter changes. Brainstem and cerebellum normal. Negative for hemorrhage or mass lesion. Ventricle size normal. Vascular: Normal arterial flow voids. Skull and upper cervical spine: No focal skeletal abnormality. Sinuses/Orbits: Mild mucosal edema paranasal sinuses. Bilateral cataract extraction. Other: None IMPRESSION: No acute abnormality.  Normal for age. Electronically Signed   By: Franchot Gallo M.D.   On: 05/31/2020 15:42   CT HEAD CODE STROKE WO CONTRAST  Result Date: 05/31/2020 CLINICAL DATA:  Code stroke. Slurred speech and confusion with resolution. EXAM: CT HEAD WITHOUT CONTRAST TECHNIQUE: Contiguous axial images were obtained from the base of the skull through the vertex without intravenous contrast. COMPARISON:  None. FINDINGS: Brain: No focal abnormality affects the brainstem or cerebellum. Cerebral hemispheres show mild chronic small-vessel ischemic change of the white matter. Punctate focal calcification at the right parietal vertex region could be a benign parenchymal calcification or a  small bit of embolized calcific plaque. No evidence of acute infarction in the region. No mass, hemorrhage, hydrocephalus or extra-axial collection. Vascular: There is atherosclerotic calcification of the major vessels at the base of the brain. Skull: Negative Sinuses/Orbits: Clear/normal Other: None ASPECTS ([place] Stroke Program Early CT Score) - Ganglionic level infarction (caudate, lentiform nuclei, internal capsule, insula, M1-M3 cortex): 7 - Supraganglionic infarction (M4-M6 cortex): 3 Total score (0-10 with 10 being normal): 10 IMPRESSION: 1. No acute finding by CT. Mild chronic small-vessel ischemic change of the cerebral hemispheric white matter. Punctate focal calcification at the right parietal vertex region could be a benign parenchymal calcification or a small bit of embolized calcific plaque. No evidence of acute stroke associated with that however. 2. ASPECTS is 10.  Electronically Signed   By: Nelson Chimes M.D.   On: 05/31/2020 14:23    EKG: Independently reviewed. Assessment/Plan   TIA  -place on tia protocol  -tele neuro checks, PT/OT/Speech -echo in am  -asa 81, start statin check lipid panel    Uncontrolled HTN  -permissive htn per protocol   HLD -statin -was diet controlled prior   GERD -not on ppi   OA -no active issues supportive care    B12 def -continue supplement    DVT prophylaxis: heparin  Code Status:Full Family Communication: n/a Disposition Plan: patient  expected to be admitted less than 2 midnights Consults called:  Admission status: med/tele  Clance Boll MD [facility]  If 7PM-7AM, please contact night-coverage [email or web address] Password [facility]  05/31/2020, 4:45 PM

## 2020-05-31 NOTE — ED Notes (Signed)
Patient assisted to restroom.  

## 2020-05-31 NOTE — ED Notes (Signed)
Patient at MRI at this time.

## 2020-05-31 NOTE — ED Notes (Signed)
Patient's daughter doing MRI screening at this time.

## 2020-05-31 NOTE — Progress Notes (Signed)
eeg completed ° °

## 2020-05-31 NOTE — ED Notes (Signed)
NO TPA per Neurologist. Family at bedside.

## 2020-06-01 ENCOUNTER — Observation Stay: Admit: 2020-06-01 | Payer: Medicare Other

## 2020-06-01 DIAGNOSIS — G454 Transient global amnesia: Secondary | ICD-10-CM | POA: Diagnosis present

## 2020-06-01 DIAGNOSIS — E162 Hypoglycemia, unspecified: Secondary | ICD-10-CM | POA: Diagnosis not present

## 2020-06-01 DIAGNOSIS — R410 Disorientation, unspecified: Secondary | ICD-10-CM

## 2020-06-01 DIAGNOSIS — E782 Mixed hyperlipidemia: Secondary | ICD-10-CM | POA: Diagnosis not present

## 2020-06-01 DIAGNOSIS — G9341 Metabolic encephalopathy: Secondary | ICD-10-CM | POA: Diagnosis not present

## 2020-06-01 LAB — LIPID PANEL
Cholesterol: 291 mg/dL — ABNORMAL HIGH (ref 0–200)
HDL: 58 mg/dL (ref >40)
LDL Cholesterol: 207 mg/dL — ABNORMAL HIGH (ref 0–99)
Total CHOL/HDL Ratio: 5 RATIO
Triglycerides: 131 mg/dL (ref <150)
VLDL: 26 mg/dL (ref 0–40)

## 2020-06-01 LAB — HEMOGLOBIN A1C
Hgb A1c MFr Bld: 5.4 % (ref 4.8–5.6)
Mean Plasma Glucose: 108.28 mg/dL

## 2020-06-01 LAB — GLUCOSE, CAPILLARY: Glucose-Capillary: 73 mg/dL (ref 70–99)

## 2020-06-01 MED ORDER — ATORVASTATIN CALCIUM 20 MG PO TABS: 80.0000 mg | ORAL_TABLET | Freq: Every day | ORAL | Status: DC

## 2020-06-01 MED ORDER — ATORVASTATIN CALCIUM 40 MG PO TABS: 40.0000 mg | ORAL_TABLET | Freq: Every day | ORAL | 2 refills | Status: DC

## 2020-06-01 MED ORDER — ATORVASTATIN CALCIUM 20 MG PO TABS: 40.0000 mg | ORAL_TABLET | Freq: Every day | ORAL | Status: DC

## 2020-06-01 MED ORDER — ASPIRIN 81 MG PO TBEC: 81.0000 mg | DELAYED_RELEASE_TABLET | Freq: Every day | ORAL | 11 refills | Status: DC

## 2020-06-01 MED ORDER — ASPIRIN EC 81 MG PO TBEC: 81.0000 mg | DELAYED_RELEASE_TABLET | Freq: Every day | ORAL | Status: DC

## 2020-06-01 NOTE — Progress Notes (Signed)
OT Cancellation Note  Patient Details Name: Sherry Phillips MRN: [id number] DOB: 1934/02/17   Cancelled Treatment:    Reason Eval/Treat Not Completed: OT screened, no needs identified, will sign off. Order received, chart reviewed. Pt back to baseline functional independence. No skilled acute OT needs identified. Family at bedside agreeable, per Rn plan to d/c home today. Will sign off. Please re-consult if additional needs arise.   Dessie Coma, M.S. OTR/L  06/01/20, 9:22 AM  ascom [phone]

## 2020-06-01 NOTE — Progress Notes (Signed)
STROKE TEAM PROGRESS NOTE   SUBJECTIVE (INTERVAL HISTORY) Her son is at the bedside.  Pt awake alert and conversing, neuro intact. Pt and son tried to piece everything together, so the story looks like that pt was very tired 2 days ago with her daughter in vacation at the beach. Yesterday she was on the phone with her sister and she seemed fall into sleep while on the phone, so her speech was slurry and not hearing her sister calling her. When her daughter went to see her, she was in sleep with her phone on her hand. Her daughter also brought EMS in pt living room, pt woke up with a lot of people, overwhelmed, still not fully awake from her sleep, confused, not answer her daughter's questions. She was back to baseline en route and in ER. So far all work up neg.    OBJECTIVE Temp:  [97.6 F (36.4 C)-98.5 F (36.9 C)] 97.6 F (36.4 C) (03/08 0735) Pulse Rate:  [73-97] 77 (03/08 0735) Cardiac Rhythm: Normal sinus rhythm;Bundle branch block (03/08 0726) Resp:  [14-25] 16 (03/08 0735) BP: (115-207)/(65-134) 145/79 (03/08 0735) SpO2:  [95 %-100 %] 98 % (03/08 0735) Weight:  [59 kg] 59 kg (03/07 1426)  Recent Labs  Lab 05/31/20 1556  GLUCAP 122*   Recent Labs  Lab 05/31/20 1403 05/31/20 1840  NA 139  --   K 3.2*  --   CL 104  --   CO2 25  --   GLUCOSE 115*  --   BUN 14  --   CREATININE 1.09* 1.06*  CALCIUM 8.8*  --    Recent Labs  Lab 05/31/20 1403  AST 17  ALT 10  ALKPHOS 75  BILITOT 0.8  PROT 6.8  ALBUMIN 3.9   Recent Labs  Lab 05/31/20 1403 05/31/20 1840  WBC 10.9* 9.3  NEUTROABS 6.0  --   HGB 14.2 14.2  HCT 43.1 43.0  MCV 91.1 90.0  PLT 271 269   No results for input(s): CKTOTAL, CKMB, CKMBINDEX, TROPONINI in the last 168 hours. Recent Labs    05/31/20 1403  LABPROT 12.2  INR 0.9   No results for input(s): COLORURINE, LABSPEC, PHURINE, GLUCOSEU, HGBUR, BILIRUBINUR, KETONESUR, PROTEINUR, UROBILINOGEN, NITRITE, LEUKOCYTESUR in the last 72 hours.  Invalid  input(s): APPERANCEUR     Component Value Date/Time   CHOL 291 (H) 06/01/2020 0635   TRIG 131 06/01/2020 0635   HDL 58 06/01/2020 0635   CHOLHDL 5.0 06/01/2020 0635   VLDL 26 06/01/2020 0635   LDLCALC 207 (H) 06/01/2020 0635   No results found for: HGBA1C No results found for: LABOPIA, COCAINSCRNUR, LABBENZ, AMPHETMU, THCU, LABBARB  No results for input(s): ETH in the last 168 hours.  I have personally reviewed the radiological images below and agree with the radiology interpretations.  DG Chest 2 View  Result Date: 05/31/2020 CLINICAL DATA:  Slurred speech EXAM: CHEST - 2 VIEW COMPARISON:  12/28/2016 FINDINGS: Frontal and lateral views of the chest demonstrate an unremarkable cardiac silhouette. No acute airspace disease, effusion, or pneumothorax. No acute bony abnormalities. IMPRESSION: 1. No acute intrathoracic process. Electronically Signed   By: Randa Ngo M.D.   On: 05/31/2020 19:36   MR ANGIO HEAD WO CONTRAST  Result Date: 05/31/2020 CLINICAL DATA:  Code stroke follow-up, abnormal speech EXAM: MRA HEAD WITHOUT CONTRAST TECHNIQUE: Angiographic images of the Circle of Willis were obtained using MRA technique without intravenous contrast. COMPARISON:  None. FINDINGS: Motion artifact is present. Intracranial internal carotid arteries are  patent with atherosclerotic irregularity. Middle and anterior cerebral arteries are patent. Possible stenosis at right MCA trifurcation branch origin. Possible bilateral distal MCA branch atherosclerotic irregularity. Intracranial vertebral arteries, basilar artery, posterior cerebral arteries are patent. No aneurysm identified. IMPRESSION: No proximal intracranial vessel occlusion. Vessel irregularity likely reflecting combination of intracranial atherosclerosis and motion degradation. Electronically Signed   By: Macy Mis M.D.   On: 05/31/2020 16:06   MR ANGIO NECK WO CONTRAST  Result Date: 05/31/2020 CLINICAL DATA:  Stroke.  Slurred speech.  EXAM: MRA NECK WITHOUT CONTRAST TECHNIQUE: Angiographic images of the neck were obtained using MRA technique without intravenous contrast. Carotid stenosis measurements (when applicable) are obtained utilizing NASCET criteria, using the distal internal carotid diameter as the denominator. COMPARISON:  MRI head 05/31/2020 FINDINGS: Antegrade flow in the carotid and vertebral arteries bilaterally. Both carotids are widely patent without stenosis. Carotid bifurcation negative. Both vertebral arteries widely patent. IMPRESSION: Negative MRA neck Electronically Signed   By: Franchot Gallo M.D.   On: 05/31/2020 15:45   MR BRAIN WO CONTRAST  Result Date: 05/31/2020 CLINICAL DATA:  Stroke.  Slurred speech EXAM: MRI HEAD WITHOUT CONTRAST TECHNIQUE: Multiplanar, multiecho pulse sequences of the brain and surrounding structures were obtained without intravenous contrast. COMPARISON:  CT head 05/31/2020 FINDINGS: Brain: Negative for acute infarct. Minimal white matter changes. Brainstem and cerebellum normal. Negative for hemorrhage or mass lesion. Ventricle size normal. Vascular: Normal arterial flow voids. Skull and upper cervical spine: No focal skeletal abnormality. Sinuses/Orbits: Mild mucosal edema paranasal sinuses. Bilateral cataract extraction. Other: None IMPRESSION: No acute abnormality.  Normal for age. Electronically Signed   By: Franchot Gallo M.D.   On: 05/31/2020 15:42   CT HEAD CODE STROKE WO CONTRAST  Result Date: 05/31/2020 CLINICAL DATA:  Code stroke. Slurred speech and confusion with resolution. EXAM: CT HEAD WITHOUT CONTRAST TECHNIQUE: Contiguous axial images were obtained from the base of the skull through the vertex without intravenous contrast. COMPARISON:  None. FINDINGS: Brain: No focal abnormality affects the brainstem or cerebellum. Cerebral hemispheres show mild chronic small-vessel ischemic change of the white matter. Punctate focal calcification at the right parietal vertex region could be  a benign parenchymal calcification or a small bit of embolized calcific plaque. No evidence of acute infarction in the region. No mass, hemorrhage, hydrocephalus or extra-axial collection. Vascular: There is atherosclerotic calcification of the major vessels at the base of the brain. Skull: Negative Sinuses/Orbits: Clear/normal Other: None ASPECTS ([place] Stroke Program Early CT Score) - Ganglionic level infarction (caudate, lentiform nuclei, internal capsule, insula, M1-M3 cortex): 7 - Supraganglionic infarction (M4-M6 cortex): 3 Total score (0-10 with 10 being normal): 10 IMPRESSION: 1. No acute finding by CT. Mild chronic small-vessel ischemic change of the cerebral hemispheric white matter. Punctate focal calcification at the right parietal vertex region could be a benign parenchymal calcification or a small bit of embolized calcific plaque. No evidence of acute stroke associated with that however. 2. ASPECTS is 10. Electronically Signed   By: Nelson Chimes M.D.   On: 05/31/2020 14:23    PHYSICAL EXAM  Temp:  [97.6 F (36.4 C)-98.5 F (36.9 C)] 97.6 F (36.4 C) (03/08 0735) Pulse Rate:  [73-97] 77 (03/08 0735) Resp:  [14-25] 16 (03/08 0735) BP: (115-207)/(65-134) 145/79 (03/08 0735) SpO2:  [95 %-100 %] 98 % (03/08 0735) Weight:  [59 kg] 59 kg (03/07 1426)  General - well nourished, well developed, in no apparent distress.    Ophthalmologic - fundi not visualized due to noncooperation.  Cardiovascular - regular rhythm and rate  Mental Status -  Level of arousal and orientation to time, place, and person were intact. Language including expression, naming, repetition, comprehension was assessed and found intact.  Cranial Nerves II - XII - II - Vision intact OU. III, IV, VI - Extraocular movements intact. V - Facial sensation intact bilaterally. VII - Facial movement intact bilaterally. VIII - Hard of hearing & vestibular intact bilaterally. X - Palate elevates symmetrically. XI  - Chin turning & shoulder shrug intact bilaterally. XII - Tongue protrusion intact.  Motor Strength - The patient's strength was normal in all extremities and pronator drift was absent.   Motor Tone & Bulk - Muscle tone was assessed at the neck and appendages and was normal.  Bulk was normal and fasciculations were absent.   Reflexes - The patient's reflexes were normal in all extremities and she had no pathological reflexes.  Sensory - Light touch, temperature/pinprick were assessed and were normal.    Coordination - The patient had normal movements in the hands with no ataxia or dysmetria.  Tremor was absent.  Gait and Station - deferred   ASSESSMENT/PLAN Ms. Sherry Phillips is a 87 y.o. female with history of HLD, arthritis, HOH found by family at home confused, not answering questions, did not follow commands, words coming out with no sense. No LOC, staring off, shaking jerking reported. In ER, neuro exam was normal with NIHSS = 0. CT head no acute finding. In ER, pt was high 207/80, now down to 187/83, and glucose 67. Given orange juice and cracker and repeat CBG 122. Pt not tPA candidate given NIHSS = 0. MRI brain no infarct, MRA head and neck unremarkable. EEG no seizure, echo pending. Etiology for pt symptoms likely due to encephalopathy coming out from deep sleep. Of course, TIA is in DDx given her age and HLD. Continue ASA and statin.   Encephalopathy right from sleep, less likely TIA  CT head no acute finding  MRI no infarct  MRA unremarkable  2D Echo  pending  EEG normal   LDL 207  HgbA1c 5.4  Heparin subq for VTE prophylaxis  No antithrombotic prior to admission, now on aspirin 81 mg daily. Continue ASA 81 on discharge.  No AEDs warranted at this time  Patient counseled to be compliant with her antithrombotic medications  Ongoing aggressive stroke risk factor management  Therapy recommendations:  none  Disposition:  home  Hypertension . BP  high on presentation . Now stable  Long term BP goal normotensive  Hyperlipidemia  Home meds:  none   LDL 207, goal < 70  Now on lipitor 40  Continue statin at discharge  Other Stroke Risk Factors  Advanced age  Other Active Problems  Hypokalemia K 3.2  Leukocytosis 10.9->9.3  Hospital day # 0  Neurology will sign off. Please call with questions. Pt will follow up with PCP in one week. Thanks for the consult.   Rosalin Hawking, MD PhD Stroke Neurology 06/01/2020 8:14 AM    To contact Stroke Continuity provider, please refer to [email or web address] After hours, contact General Neurology

## 2020-06-01 NOTE — Evaluation (Signed)
Physical Therapy Evaluation Patient Details Name: Sanita Estrada MRN: [id number] DOB: [birth year] Today's Date: 06/01/2020   History of Present Illness  presented to ER secondary to slurred speech, AMS; admitted for TIA/CVA work up. MRI negative; symptoms fully resolved at this time.  Clinical Impression  Patient sitting indep edge of bed upon arrival to room; alert, oriented and follows commands.  Denies pain and reports all presenting symptoms have fully resolved.  Bilat UE/LE strength and ROM grossly symmetrical and WFL; no focal weakness, sensory or coordination deficits appreciated.  Able to complete bed mobility with indep; sit/stand, basic transfers and gait (80') without assist device, mod indep.  Demonstrates reciprocal stepping with good step height/lenght, good trunk rotation and arm swing; completes dynamic gait components without difficulty, LOB or safety concern.  Feels gait performance is baseline for her; son verifies and in agreement BERG performance 50/56; higher level balance deficits in SLS noted (reports baseline for her as well).  Appropriate awareness and use of compensatory strategies as needed.  Reviewed recommendations for UE support with stairs, minimization of tasks requiring divided attention to optimize safety with mobility tasks.  Patient/son voiced understanding and agreement. Given performance noted during session, no acute PT needs identified at this time.  Will complete initial order; please re-consult should needs change prior to discharge.    Follow Up Recommendations No PT follow up    Equipment Recommendations       Recommendations for Other Services       Precautions / Restrictions Precautions Precautions: None Restrictions Weight Bearing Restrictions: No      Mobility  Bed Mobility Overal bed mobility: Modified Independent                  Transfers Overall transfer level: Modified independent Equipment used: None              General transfer comment: good LE strength, power and control  Ambulation/Gait Ambulation/Gait assistance: Modified independent (Device/Increase time) Gait Distance (Feet): 80 Feet Assistive device: None       General Gait Details: reciprocal stepping with good step height/lenght, good trunk rotation and arm swing; completes dynamic gait components without difficulty, LOB or safety concern.  Feels gait performance is baseline for her; son verifies and in agreement  Stairs            Wheelchair Mobility    Modified Rankin (Stroke Patients Only)       Balance Overall balance assessment: Modified Independent                               Standardized Balance Assessment Standardized Balance Assessment : Berg Balance Test Berg Balance Test Sit to Stand: Able to stand without using hands and stabilize independently Standing Unsupported: Able to stand safely 2 minutes Sitting with Back Unsupported but Feet Supported on Floor or Stool: Able to sit safely and securely 2 minutes Stand to Sit: Sits safely with minimal use of hands Transfers: Able to transfer safely, minor use of hands Standing Unsupported with Eyes Closed: Able to stand 10 seconds safely Standing Ubsupported with Feet Together: Able to place feet together independently and stand 1 minute safely From Standing, Reach Forward with Outstretched Arm: Can reach confidently >25 cm (10") From Standing Position, Pick up Object from Floor: Able to pick up shoe safely and easily From Standing Position, Turn to Look Behind Over each Shoulder: Looks behind from both sides and weight shifts  well Turn 360 Degrees: Able to turn 360 degrees safely in 4 seconds or less Standing Unsupported, Alternately Place Feet on Step/Stool: Able to complete >2 steps/needs minimal assist Standing Unsupported, One Foot in Front: Able to plae foot ahead of the other independently and hold 30 seconds Standing on One Leg: Able to  lift leg independently and hold equal to or more than 3 seconds Total Score: 50         Pertinent Vitals/Pain Pain Assessment: No/denies pain    Home Living Family/patient expects to be discharged to:: Private residence Living Arrangements: Children Available Help at Discharge: Family Type of Home: House Home Access: Stairs to enter Entrance Stairs-Rails: Right Entrance Stairs-Number of Steps: 3 Home Layout: Two level;Bed/bath upstairs Home Equipment: None      Prior Function Level of Independence: Independent               Hand Dominance   Dominant Hand: Right    Extremity/Trunk Assessment   Upper Extremity Assessment Upper Extremity Assessment: Overall WFL for tasks assessed (grossly 4+ to 5/5 throughout; no focal weakness, sensory or coordination deficits)    Lower Extremity Assessment Lower Extremity Assessment: Overall WFL for tasks assessed (grossly 4+ to 5/5 throughout; no focal weakness, sensory or coordination deficits)       Communication   Communication: No difficulties  Cognition Arousal/Alertness: Awake/alert Behavior During Therapy: WFL for tasks assessed/performed Overall Cognitive Status: Within Functional Limits for tasks assessed                                        General Comments      Exercises     Assessment/Plan    PT Assessment Patent does not need any further PT services  PT Problem List Decreased balance;Decreased mobility       PT Treatment Interventions Gait training;Functional mobility training;Therapeutic activities;Therapeutic exercise;Balance training;Neuromuscular re-education;Patient/family education    PT Goals (Current goals can be found in the Care Plan section)  Acute Rehab PT Goals Patient Stated Goal: to return home as soon as I can! PT Goal Formulation: With patient/family Time For Goal Achievement: 06/01/20 Potential to Achieve Goals: Good    Frequency     Barriers to discharge         Co-evaluation               AM-PAC PT "6 Clicks" Mobility  Outcome Measure Help needed turning from your back to your side while in a flat bed without using bedrails?: None Help needed moving from lying on your back to sitting on the side of a flat bed without using bedrails?: None Help needed moving to and from a bed to a chair (including a wheelchair)?: None Help needed standing up from a chair using your arms (e.g., wheelchair or bedside chair)?: None Help needed to walk in hospital room?: None Help needed climbing 3-5 steps with a railing? : A Little 6 Click Score: 23    End of Session Equipment Utilized During Treatment: Gait belt Activity Tolerance: Patient tolerated treatment well Patient left: in bed;with call bell/phone within reach;with family/visitor present Nurse Communication: Mobility status PT Visit Diagnosis: Other symptoms and signs involving the nervous system (R29.898)    Time: 2751-7001 PT Time Calculation (min) (ACUTE ONLY): 16 min   Charges:   PT Evaluation $PT Eval Low Complexity: 1 Low  Varick Keys H. Owens Shark, PT, DPT, NCS 06/01/20, 10:02 AM [phone]

## 2020-06-01 NOTE — Plan of Care (Signed)
Pt admitted to room 123 Alert and oriented x 4. NIH 0. HOH and daughter at bedside to assist with care and admission info. Telemetry and continuous pox placed. Will continue to monitor.

## 2020-06-01 NOTE — Progress Notes (Signed)
SLP Cancellation Note  Patient Details Name: Sherry Phillips MRN: [id number] DOB: 19-Jun-1933   Cancelled treatment:       Reason Eval/Treat Not Completed: SLP screened, no needs identified, will sign off (chart reviewed; consulted NSG then met w/ pt, husband in room) Pt denied any difficulty swallowing and is currently on a regular diet; tolerates swallowing pills w/ water per NSG. Pt conversed at conversational level w/out deficits noted; pt and Husband denied any speech-language deficits. She engaged in pleasant conversation w/ this SLP, husband re: her Lunch meal she wanted to order, snacks request too which was provided. No further skilled ST services indicated as pt appears at her baseline. Pt agreed. NSG to reconsult if any change in status while admitted.      Orinda Kenner, MS, CCC-SLP Speech Language Pathologist Rehab Services [phone] [facility] 06/01/2020, 10:39 AM

## 2020-06-01 NOTE — Procedures (Signed)
Patient Name: Eloyce Bultman  MRN: [id number]  Epilepsy Attending: Lora Havens  Referring Physician/Provider: Dr Rosalin Hawking Date: 05/31/2020 Duration: 21.13 mins  Patient history: 87 year old female with altered mental status.  EEG to evaluate for seizures.  Level of alertness: Awake  AEDs during EEG study: None  Technical aspects: This EEG study was done with scalp electrodes positioned according to the 10-20 International system of electrode placement. Electrical activity was acquired at a sampling rate of 500Hz  and reviewed with a high frequency filter of 70Hz  and a low frequency filter of 1Hz . EEG data were recorded continuously and digitally stored.   Description: The posterior dominant rhythm consists of 9 Hz activity of moderate voltage (25-35 uV) seen predominantly in posterior head regions, symmetric and reactive to eye opening and eye closing. Physiologic photic driving was not seen during photic stimulation. Hyperventilation was not performed.     IMPRESSION: This study is within normal limits. No seizures or epileptiform discharges were seen throughout the recording.   Eugenia Eldredge Barbra Sarks

## 2020-06-01 NOTE — Discharge Summary (Signed)
Physician Discharge Summary  Sherry Phillips FXO:[id number] DOB: 1933/06/16 DOA: 05/31/2020  PCP: [name], MD  Admit date: 05/31/2020 Discharge date: 06/01/2020  Admitted From: Home Disposition: Home  Recommendations for Outpatient Follow-up:  1. Follow up with PCP in 1-2 weeks 2. We will send referral to neurology for follow-up  Home Health: Not needed Equipment/Devices: Not needed  Discharge Condition: Stable CODE STATUS: Full code Diet recommendation: Low-salt diet  Discharge summary:  87 year old female with history of hyperlipidemia not treated, arthritis but no other chronic medical issues from home who was found to be with slurring of speech when she was talking on the phone.  EMS was called by family, patient was noted to look like unable to wake up from sleep, she was trying to speak few words but they were not appropriate to the questions.  When asked in the emergency room, she states she was in a nap then all of a sudden so many people were in her house.  Arrival to ER, blood pressure 207/80.  Glucose 67.  Neuro exam normal.  All symptoms improved by the time she arrived to ER.  Admitted as TIA.  TIA: Clinical findings, transient amnesia sleepiness and difficulty speaking.  Absolutely back to normal on arrival to ER. CT head findings, normal. MRI of the brain, normal. MRA of the head and neck, normal. 2D echocardiogram, not done.  Scheduled for tomorrow, however patient does not need to wait in the hospital.  Currently no indication to do 1. Antiplatelet therapy, none at home.  Will start on baby aspirin. LDL more than 200.  Has history of hyperlipidemia but patient was not treated.  She usually does not take any medications.  Recommended high intensity statin for stroke prevention, however patient and family chose to take smaller doses.  Discharged on Lipitor 40 mg daily. Hemoglobin A1c is 5.4.  No indication for treatment. Therapy recommendations, normal.  No  further intervention needed. Blood pressures elevated on arrival, however normal at this time.  Patient chose not to go on medications.  Patient is without any acute findings today.  She is walking around the hallway.  Treating as TIA.  Neurology cleared for discharge on aspirin and Lipitor.  Discussed with patient and family.  Referral sent to outpatient neurology follow-up.     Discharge Diagnoses:  Principal Problem:   Transient global amnesia Active Problems:   TIA (transient ischemic attack)    Discharge Instructions  Discharge Instructions    Ambulatory referral to Neurology   Complete by: As directed    An appointment is requested in approximately: 4 weeks   Call MD for:   Complete by: As directed    Any recurrence of symptoms   Diet - low sodium heart healthy   Complete by: As directed    Increase activity slowly   Complete by: As directed      Allergies as of 06/01/2020      Reactions   Egg White [albumen, Egg] Rash      Medication List    TAKE these medications   aspirin 81 MG EC tablet Take 1 tablet (81 mg total) by mouth daily. Swallow whole.   atorvastatin 40 MG tablet Commonly known as: LIPITOR Take 1 tablet (40 mg total) by mouth daily.   fexofenadine 180 MG tablet Commonly known as: ALLEGRA Take 180 mg by mouth daily as needed.   PreserVision AREDS 2 Caps Take by mouth daily.   ranitidine 150 MG capsule Commonly known as:  ZANTAC Take 150 mg by mouth 2 (two) times daily.   vitamin B-12 1000 MCG tablet Commonly known as: CYANOCOBALAMIN Take 1,000 mcg by mouth every other day.   ZANFEL EX Apply topically as needed.       Follow-up Information    [name], MD Follow up in 2 week(s).   Specialties: Internal Medicine, Pediatrics Contact information: [place] [address] [phone]              Allergies  Allergen Reactions  . Egg Sherry Phillips, Egg] Rash     Consultations:  Neurology   Procedures/Studies: DG Chest 2 View  Result Date: 05/31/2020 CLINICAL DATA:  Slurred speech EXAM: CHEST - 2 VIEW COMPARISON:  12/28/2016 FINDINGS: Frontal and lateral views of the chest demonstrate an unremarkable cardiac silhouette. No acute airspace disease, effusion, or pneumothorax. No acute bony abnormalities. IMPRESSION: 1. No acute intrathoracic process. Electronically Signed   By: Randa Ngo M.D.   On: 05/31/2020 19:36   MR ANGIO HEAD WO CONTRAST  Result Date: 05/31/2020 CLINICAL DATA:  Code stroke follow-up, abnormal speech EXAM: MRA HEAD WITHOUT CONTRAST TECHNIQUE: Angiographic images of the Circle of Willis were obtained using MRA technique without intravenous contrast. COMPARISON:  None. FINDINGS: Motion artifact is present. Intracranial internal carotid arteries are patent with atherosclerotic irregularity. Middle and anterior cerebral arteries are patent. Possible stenosis at right MCA trifurcation branch origin. Possible bilateral distal MCA branch atherosclerotic irregularity. Intracranial vertebral arteries, basilar artery, posterior cerebral arteries are patent. No aneurysm identified. IMPRESSION: No proximal intracranial vessel occlusion. Vessel irregularity likely reflecting combination of intracranial atherosclerosis and motion degradation. Electronically Signed   By: Macy Mis M.D.   On: 05/31/2020 16:06   MR ANGIO NECK WO CONTRAST  Result Date: 05/31/2020 CLINICAL DATA:  Stroke.  Slurred speech. EXAM: MRA NECK WITHOUT CONTRAST TECHNIQUE: Angiographic images of the neck were obtained using MRA technique without intravenous contrast. Carotid stenosis measurements (when applicable) are obtained utilizing NASCET criteria, using the distal internal carotid diameter as the denominator. COMPARISON:  MRI head 05/31/2020 FINDINGS: Antegrade flow in the carotid and vertebral arteries bilaterally. Both carotids are widely patent without stenosis.  Carotid bifurcation negative. Both vertebral arteries widely patent. IMPRESSION: Negative MRA neck Electronically Signed   By: Franchot Gallo M.D.   On: 05/31/2020 15:45   MR BRAIN WO CONTRAST  Result Date: 05/31/2020 CLINICAL DATA:  Stroke.  Slurred speech EXAM: MRI HEAD WITHOUT CONTRAST TECHNIQUE: Multiplanar, multiecho pulse sequences of the brain and surrounding structures were obtained without intravenous contrast. COMPARISON:  CT head 05/31/2020 FINDINGS: Brain: Negative for acute infarct. Minimal white matter changes. Brainstem and cerebellum normal. Negative for hemorrhage or mass lesion. Ventricle size normal. Vascular: Normal arterial flow voids. Skull and upper cervical spine: No focal skeletal abnormality. Sinuses/Orbits: Mild mucosal edema paranasal sinuses. Bilateral cataract extraction. Other: None IMPRESSION: No acute abnormality.  Normal for age. Electronically Signed   By: Franchot Gallo M.D.   On: 05/31/2020 15:42   EEG adult  Result Date: 06/01/2020 Lora Havens, MD     06/01/2020  8:36 AM Patient Name: Safiyah Cisney MRN: [id number] Epilepsy Attending: Lora Havens Referring Physician/Provider: Dr Rosalin Hawking Date: 05/31/2020 Duration: 21.13 mins Patient history: 87 year old female with altered mental status.  EEG to evaluate for seizures. Level of alertness: Awake AEDs during EEG study: None Technical aspects: This EEG study was done with scalp electrodes positioned according to the 10-20 International system of electrode placement. Dealer  activity was acquired at a sampling rate of 500Hz  and reviewed with a high frequency filter of 70Hz  and a low frequency filter of 1Hz . EEG data were recorded continuously and digitally stored. Description: The posterior dominant rhythm consists of 9 Hz activity of moderate voltage (25-35 uV) seen predominantly in posterior head regions, symmetric and reactive to eye opening and eye closing. Physiologic photic driving was not seen during  photic stimulation. Hyperventilation was not performed.   IMPRESSION: This study is within normal limits. No seizures or epileptiform discharges were seen throughout the recording. Lora Havens   CT HEAD CODE STROKE WO CONTRAST  Result Date: 05/31/2020 CLINICAL DATA:  Code stroke. Slurred speech and confusion with resolution. EXAM: CT HEAD WITHOUT CONTRAST TECHNIQUE: Contiguous axial images were obtained from the base of the skull through the vertex without intravenous contrast. COMPARISON:  None. FINDINGS: Brain: No focal abnormality affects the brainstem or cerebellum. Cerebral hemispheres show mild chronic small-vessel ischemic change of the white matter. Punctate focal calcification at the right parietal vertex region could be a benign parenchymal calcification or a small bit of embolized calcific plaque. No evidence of acute infarction in the region. No mass, hemorrhage, hydrocephalus or extra-axial collection. Vascular: There is atherosclerotic calcification of the major vessels at the base of the brain. Skull: Negative Sinuses/Orbits: Clear/normal Other: None ASPECTS ([place] Stroke Program Early CT Score) - Ganglionic level infarction (caudate, lentiform nuclei, internal capsule, insula, M1-M3 cortex): 7 - Supraganglionic infarction (M4-M6 cortex): 3 Total score (0-10 with 10 being normal): 10 IMPRESSION: 1. No acute finding by CT. Mild chronic small-vessel ischemic change of the cerebral hemispheric white matter. Punctate focal calcification at the right parietal vertex region could be a benign parenchymal calcification or a small bit of embolized calcific plaque. No evidence of acute stroke associated with that however. 2. ASPECTS is 10. Electronically Signed   By: Nelson Chimes M.D.   On: 05/31/2020 14:23    (Echo, Carotid, EGD, Colonoscopy, ERCP)    Subjective: Patient seen and examined.  Family at the bedside.  Patient thinks he was just sleepy and was not awake enough to have conversation  when all this happened to her yesterday.  No other overnight events.  Ready to go home.   Discharge Exam: Vitals:   06/01/20 0735 06/01/20 1151  BP: (!) 145/79 138/69  Pulse: 77 77  Resp: 16 16  Temp: 97.6 F (36.4 C) 98 F (36.7 C)  SpO2: 98% 99%   Vitals:   06/01/20 0500 06/01/20 0600 06/01/20 0735 06/01/20 1151  BP: (!) 144/65  (!) 145/79 138/69  Pulse: 80 82 77 77  Resp: 16 (!) 23 16 16   Temp: 98.2 F (36.8 C)  97.6 F (36.4 C) 98 F (36.7 C)  TempSrc: Oral     SpO2: 98% 98% 98% 99%  Weight:      Height:        General: Pt is alert, awake, not in acute distress Cardiovascular: RRR, S1/S2 +, no rubs, no gallops Respiratory: CTA bilaterally, no wheezing, no rhonchi Abdominal: Soft, NT, ND, bowel sounds + Extremities: no edema, no cyanosis No neurological deficits.    The results of significant diagnostics from this hospitalization (including imaging, microbiology, ancillary and laboratory) are listed below for reference.     Microbiology: No results found for this or any previous visit (from the past 240 hour(s)).   Labs: BNP (last 3 results) No results for input(s): BNP in the last 8760 hours. Basic Metabolic Panel: Recent  Labs  Lab 05/31/20 1403 05/31/20 1840  NA 139  --   K 3.2*  --   CL 104  --   CO2 25  --   GLUCOSE 115*  --   BUN 14  --   CREATININE 1.09* 1.06*  CALCIUM 8.8*  --    Liver Function Tests: Recent Labs  Lab 05/31/20 1403  AST 17  ALT 10  ALKPHOS 75  BILITOT 0.8  PROT 6.8  ALBUMIN 3.9   No results for input(s): LIPASE, AMYLASE in the last 168 hours. No results for input(s): AMMONIA in the last 168 hours. CBC: Recent Labs  Lab 05/31/20 1403 05/31/20 1840  WBC 10.9* 9.3  NEUTROABS 6.0  --   HGB 14.2 14.2  HCT 43.1 43.0  MCV 91.1 90.0  PLT 271 269   Cardiac Enzymes: No results for input(s): CKTOTAL, CKMB, CKMBINDEX, TROPONINI in the last 168 hours. BNP: Invalid input(s): POCBNP CBG: Recent Labs  Lab  05/31/20 1402 05/31/20 1556  GLUCAP 73 122*   D-Dimer No results for input(s): DDIMER in the last 72 hours. Hgb A1c Recent Labs    06/01/20 0635  HGBA1C 5.4   Lipid Profile Recent Labs    06/01/20 0635  CHOL 291*  HDL 58  LDLCALC 207*  TRIG 131  CHOLHDL 5.0   Thyroid function studies No results for input(s): TSH, T4TOTAL, T3FREE, THYROIDAB in the last 72 hours.  Invalid input(s): FREET3 Anemia work up No results for input(s): VITAMINB12, FOLATE, FERRITIN, TIBC, IRON, RETICCTPCT in the last 72 hours. Urinalysis    Component Value Date/Time   BILIRUBINUR negative 05/14/2017 1445   PROTEINUR negative 05/14/2017 1445   UROBILINOGEN 0.2 05/14/2017 1445   NITRITE negative 05/14/2017 1445   LEUKOCYTESUR Moderate (2+) (A) 05/14/2017 1445   Sepsis Labs Invalid input(s): PROCALCITONIN,  WBC,  LACTICIDVEN Microbiology No results found for this or any previous visit (from the past 240 hour(s)).   Time coordinating discharge:  32 minutes  SIGNED:   Barb Merino, MD  [facility] 06/01/2020, 1:18 PM

## 2020-06-15 ENCOUNTER — Other Ambulatory Visit: Payer: Self-pay

## 2020-06-15 ENCOUNTER — Ambulatory Visit (INDEPENDENT_AMBULATORY_CARE_PROVIDER_SITE_OTHER): Payer: Medicare Other | Admitting: Internal Medicine

## 2020-06-15 ENCOUNTER — Encounter: Payer: Self-pay | Admitting: Internal Medicine

## 2020-06-15 DIAGNOSIS — G459 Transient cerebral ischemic attack, unspecified: Secondary | ICD-10-CM

## 2020-06-15 NOTE — Progress Notes (Signed)
Subjective:    Patient ID: Sherry Phillips, female    DOB: [birth year], 87 y.o.   MRN: [id number]  HPI Here with daughter for hospital follow up This visit occurred during the SARS-CoV-2 public health emergency.  Safety protocols were in place, including screening questions prior to the visit, additional usage of staff PPE, and extensive cleaning of exam room while observing appropriate contact time as indicated for disinfecting solutions.   She doesn't really remember what happened---until she got to the hospital Remembers seeing daughter in house but didn't recognize Has spoken to sister in [place] on the phone---couldn't hear her and words were slurred. Contacted daughter She ran to the house---she was sitting in chair with blank stare Words were jumbled Called 911 and then came quickly No focal weakness or facial droop--but still aphasic Starting to come out of it by the time she was walking to the ambulance Back to normal by the time she got to the ER  Reviewed hospital records CT of head--then MRI----normal MR Angiogram was normal Labs okay BP fine--after initial elevation Admitted only overnight----not able to get the echo before she left  Is now on atorvastatin and ASA No recurrence of problems Just fatigue (vague)---- now finally feels back to normal  Does have some visual migraines No symptoms prior to the event though  Current Outpatient Medications on File Prior to Visit  Medication Sig Dispense Refill  . aspirin EC 81 MG EC tablet Take 1 tablet (81 mg total) by mouth daily. Swallow whole. 30 tablet 11  . atorvastatin (LIPITOR) 40 MG tablet Take 1 tablet (40 mg total) by mouth daily. 30 tablet 2  . famotidine (ZANTAC 360 MAX ST) 20 MG tablet Take 20 mg by mouth 2 (two) times daily.    . fexofenadine (ALLEGRA) 180 MG tablet Take 180 mg by mouth daily as needed.     . Multiple Vitamins-Minerals (PRESERVISION AREDS 2) CAPS Take by mouth daily.    . Poison Ivy  Treatments (ZANFEL EX) Apply topically as needed.    . vitamin B-12 (CYANOCOBALAMIN) 1000 MCG tablet Take 1,000 mcg by mouth every other day.     No current facility-administered medications on file prior to visit.    Allergies  Allergen Reactions  . Egg Donia Pounds, Egg] Rash    Past Medical History:  Diagnosis Date  . GERD (gastroesophageal reflux disease)   . Hiatal hernia   . Hyperlipidemia   . Osteoarthritis     Past Surgical History:  Procedure Laterality Date  . APPENDECTOMY  1962   with ruptured ovary repair  . BREAST BIOPSY  1970  . CATARACT EXTRACTION     both eyes  . ROBOTIC ASSISTED LAPAROSCOPIC REPAIR OF PARAESOPHAGEAL HERNIA  01/2017   Dr Grandville Silos at [facility] History  Problem Relation Age of Onset  . Hypertension Mother   . Leukemia Mother   . COPD Father   . Diabetes Sister        borderline  . Coronary artery disease Brother   . Lung disease Brother   . Cancer Neg Hx        no breast or colon cancer    Social History   Socioeconomic History  . Marital status: Widowed    Spouse name: Not on file  . Number of children: 2  . Years of education: Not on file  . Highest education level: Not on file  Occupational History  . Occupation: homemaker  Tobacco Use  .  Smoking status: Never Smoker  . Smokeless tobacco: Never Used  Vaping Use  . Vaping Use: Never used  Substance and Sexual Activity  . Alcohol use: No  . Drug use: No  . Sexual activity: Never  Other Topics Concern  . Not on file  Social History Narrative   Widowed 2015      One son and one daughter, [name]   Has living will.  Requests daughter to make decisions if needed.    Would accept CPR at this point, but no prolonged artificial ventilation.     Would not want tube feeds.   Social Determinants of Health   Financial Resource Strain: Not on file  Food Insecurity: Not on file  Transportation Needs: Not on file  Physical Activity: Not on file  Stress: Not  on file  Social Connections: Not on file  Intimate Partner Violence: Not on file   Review of Systems Sleeping fine Appetite is good No depression or mood issues No chest pain or SOB    Objective:   Physical Exam Constitutional:      Appearance: Normal appearance.  Cardiovascular:     Rate and Rhythm: Normal rate and regular rhythm.     Pulses: Normal pulses.     Heart sounds: No murmur heard. No gallop.   Pulmonary:     Effort: Pulmonary effort is normal.     Breath sounds: Normal breath sounds. No wheezing or rales.  Musculoskeletal:     Cervical back: Neck supple.     Right lower leg: No edema.     Left lower leg: No edema.  Lymphadenopathy:     Cervical: No cervical adenopathy.  Neurological:     Mental Status: She is alert and oriented to person, place, and time.     Cranial Nerves: Cranial nerves are intact. No dysarthria or facial asymmetry.     Motor: No weakness or abnormal muscle tone.     Coordination: Romberg sign negative. Coordination normal. Finger-Nose-Finger Test normal.     Gait: Gait is intact. Gait normal.  Psychiatric:        Mood and Affect: Mood normal.        Behavior: Behavior normal.            Assessment & Plan:

## 2020-06-15 NOTE — Assessment & Plan Note (Signed)
Classic transient neurologic event---no more than 1 hour Labs, MRI of brain and MR angiograms all normal On statin and ASA now Will check echo Doesn't really need neurology follow up

## 2020-06-16 DIAGNOSIS — H353221 Exudative age-related macular degeneration, left eye, with active choroidal neovascularization: Secondary | ICD-10-CM | POA: Diagnosis not present

## 2020-06-17 ENCOUNTER — Telehealth: Payer: Self-pay

## 2020-06-17 NOTE — Telephone Encounter (Signed)
Called [facility] in [place] and they had cancellation on 06/24/20. Patient rescheduled. Merry Proud advised.

## 2020-06-17 NOTE — Telephone Encounter (Signed)
I did not mention a specific time by which it needed to be done. I expect it to be normal  That is pretty far out though---if we can get it sooner somewhere else convenient--that would be better Or a cancellation list?

## 2020-06-17 NOTE — Telephone Encounter (Signed)
There really is not much Rena and I can do. I have routed this to Anastasiya, also.

## 2020-06-17 NOTE — Telephone Encounter (Signed)
Sherry Phillips (DPR signed) said pt had visit with Dr Silvio Pate on 06/15/20; Sherry Phillips understood that Dr Silvio Pate wanted pt to have echo by 06/22/20. Sherry Phillips said first available in [place] for echo was 07/12/20 and Sherry Phillips wants to verify with Dr Silvio Pate that waiting  Until 07/12/20 is OK. Sherry Phillips request cb.

## 2020-06-24 ENCOUNTER — Other Ambulatory Visit: Payer: Self-pay

## 2020-06-24 ENCOUNTER — Ambulatory Visit (INDEPENDENT_AMBULATORY_CARE_PROVIDER_SITE_OTHER): Payer: Medicare Other

## 2020-06-24 DIAGNOSIS — G459 Transient cerebral ischemic attack, unspecified: Secondary | ICD-10-CM | POA: Diagnosis not present

## 2020-06-24 LAB — ECHOCARDIOGRAM COMPLETE
AR max vel: 1.9 cm2
AV Area VTI: 1.97 cm2
AV Area mean vel: 1.83 cm2
AV Mean grad: 5 mmHg
AV Peak grad: 8.1 mmHg
Ao pk vel: 1.42 m/s
Area-P 1/2: 2.53 cm2
P 1/2 time: 655 msec
S' Lateral: 2.3 cm

## 2020-06-25 ENCOUNTER — Other Ambulatory Visit: Payer: Self-pay | Admitting: Internal Medicine

## 2020-06-25 DIAGNOSIS — G459 Transient cerebral ischemic attack, unspecified: Secondary | ICD-10-CM

## 2020-06-30 DIAGNOSIS — H353211 Exudative age-related macular degeneration, right eye, with active choroidal neovascularization: Secondary | ICD-10-CM | POA: Diagnosis not present

## 2020-06-30 DIAGNOSIS — Q248 Other specified congenital malformations of heart: Secondary | ICD-10-CM

## 2020-07-12 ENCOUNTER — Other Ambulatory Visit: Payer: Medicare Other

## 2020-08-16 ENCOUNTER — Encounter: Payer: Self-pay | Admitting: Internal Medicine

## 2020-08-16 ENCOUNTER — Telehealth: Payer: Self-pay

## 2020-08-16 ENCOUNTER — Ambulatory Visit (INDEPENDENT_AMBULATORY_CARE_PROVIDER_SITE_OTHER): Payer: Medicare Other | Admitting: Internal Medicine

## 2020-08-16 ENCOUNTER — Other Ambulatory Visit: Payer: Self-pay

## 2020-08-16 DIAGNOSIS — B019 Varicella without complication: Secondary | ICD-10-CM | POA: Diagnosis not present

## 2020-08-16 DIAGNOSIS — B029 Zoster without complications: Secondary | ICD-10-CM

## 2020-08-16 MED ORDER — VALACYCLOVIR HCL 1 G PO TABS: 1000.0000 mg | ORAL_TABLET | Freq: Three times a day (TID) | ORAL | 1 refills | Status: AC

## 2020-08-16 NOTE — Progress Notes (Signed)
Subjective:    Patient ID: Sherry Phillips, female    DOB: [birth year], 87 y.o.   MRN: [id number]  HPI Here due to a rash along her side This visit occurred during the SARS-CoV-2 public health emergency.  Safety protocols were in place, including screening questions prior to the visit, additional usage of staff PPE, and extensive cleaning of exam room while observing appropriate contact time as indicated for disinfecting solutions.   Noticed rash in the past 2 days along left side---lower abdomen Itchy but not really painful Sensitive to touch--like when her clothes touch it  No Rx  Current Outpatient Medications on File Prior to Visit  Medication Sig Dispense Refill  . aspirin EC 81 MG EC tablet Take 1 tablet (81 mg total) by mouth daily. Swallow whole. 30 tablet 11  . atorvastatin (LIPITOR) 40 MG tablet Take 1 tablet (40 mg total) by mouth daily. 30 tablet 2  . famotidine (PEPCID) 20 MG tablet Take 20 mg by mouth 2 (two) times daily.    . fexofenadine (ALLEGRA) 180 MG tablet Take 180 mg by mouth daily as needed.     . Multiple Vitamins-Minerals (PRESERVISION AREDS 2) CAPS Take by mouth daily.    . vitamin B-12 (CYANOCOBALAMIN) 1000 MCG tablet Take 1,000 mcg by mouth every other day.     No current facility-administered medications on file prior to visit.    Allergies  Allergen Reactions  . Egg Sherry Phillips, Egg] Rash    Past Medical History:  Diagnosis Date  . GERD (gastroesophageal reflux disease)   . Hiatal hernia   . Hyperlipidemia   . Osteoarthritis     Past Surgical History:  Procedure Laterality Date  . APPENDECTOMY  1962   with ruptured ovary repair  . BREAST BIOPSY  1970  . CATARACT EXTRACTION     both eyes  . ROBOTIC ASSISTED LAPAROSCOPIC REPAIR OF PARAESOPHAGEAL HERNIA  01/2017   Dr Grandville Silos at [facility] History  Problem Relation Age of Onset  . Hypertension Mother   . Leukemia Mother   . COPD Father   . Diabetes Sister         borderline  . Coronary artery disease Brother   . Lung disease Brother   . Cancer Neg Hx        no breast or colon cancer    Social History   Socioeconomic History  . Marital status: Widowed    Spouse name: Not on file  . Number of children: 2  . Years of education: Not on file  . Highest education level: Not on file  Occupational History  . Occupation: homemaker  Tobacco Use  . Smoking status: Never Smoker  . Smokeless tobacco: Never Used  Vaping Use  . Vaping Use: Never used  Substance and Sexual Activity  . Alcohol use: No  . Drug use: No  . Sexual activity: Never  Other Topics Concern  . Not on file  Social History Narrative   Widowed 2015      One son and one daughter, Sherry Phillips   Has living will.  Requests daughter to make decisions if needed.    Would accept CPR at this point, but no prolonged artificial ventilation.     Would not want tube feeds.   Social Determinants of Health   Financial Resource Strain: Not on file  Food Insecurity: Not on file  Transportation Needs: Not on file  Physical Activity: Not on file  Stress: Not on  file  Social Connections: Not on file  Intimate Partner Violence: Not on file   Review of Systems  No fever Doesn't feel sick     Objective:   Physical Exam Skin:    Comments: Linear red papules which extend around the left upper lumbar dermatome (?L1-2). Mostly LLQ but a little around towards back No vesicles            Assessment & Plan:

## 2020-08-16 NOTE — Assessment & Plan Note (Signed)
Hasn't blistered (become vesicular) but the dermatomal distribution is fairly typical for zoster Just itching for now fortunately Will treat with valacyclovir

## 2020-08-16 NOTE — Telephone Encounter (Signed)
Okay---I will assess at the visit

## 2020-08-16 NOTE — Telephone Encounter (Signed)
[place] Night - Client TELEPHONE ADVICE RECORD [facility] Patient Name: Sherry Phillips Gender: Female DOB: [birth year] Age: 87 Y 51 M 9 D Return Phone Number: [phone] (Primary) Address: City/ State/ Zip: [place] [address] Client [place] Night - Client Client Site [facility] Physician Viviana Simpler- MD Contact Type Call Who Is Calling Patient / Member / Family / Caregiver Call Type Triage / Clinical Relationship To Patient Self Return Phone Number [phone] (Primary) Chief Complaint Unclassified Symptom Reason for Call Symptomatic / Request for [place] thinks she has shingles. It is around her waist and started on Saturday. Translation No Nurse Assessment Nurse: Valentino Nose, RN, [place] Date/Time (Eastern Time): 08/16/2020 7:36:11 AM Confirm and document reason for call. If symptomatic, describe symptoms. ---Caller states she thinks she may have shingles. It is on her left side around her waist that started on Saturday and it is itchy. It is very red. No fever. Does the patient have any new or worsening symptoms? ---Yes Will a triage be completed? ---Yes Related visit to physician within the last 2 weeks? ---No Does the PT have any chronic conditions? (i.e. diabetes, asthma, this includes High risk factors for pregnancy, etc.) ---No Is this a behavioral health or substance abuse call? ---No Guidelines Guideline Title Affirmed Question Affirmed Notes Nurse Date/Time (Eastern Time) Shingles (Zoster) [1] Shingles rash (matches SYMPTOMS) AND [2] onset < 72 hours ago (3 days) Valentino Nose, RN, [place] 08/16/2020 7:37:37 AM Disp. Time Eilene Ghazi Time) Disposition Final User 08/16/2020 7:39:56 AM See PCP within 24 Hours Yes Valentino Nose, RN, [place] PLEASE NOTE: All timestamps contained within this report are represented as [place] Standard  Time. CONFIDENTIALTY NOTICE: This fax transmission is intended only for the addressee. It contains information that is legally privileged, confidential or otherwise protected from use or disclosure. If you are not the intended recipient, you are strictly prohibited from reviewing, disclosing, copying using or disseminating any of this information or taking any action in reliance on or regarding this information. If you have received this fax in error, please notify us immediately by telephone so that we can arrange for its return to [place]. Phone: [phone], Toll-Free: [phone], Fax: [phone] Page: 2 of 2 Call Id: [id number] [place] Disagree/Comply Comply Caller Understands Yes PreDisposition Call Doctor Care Advice Given Per Guideline CALL BACK IF: * Fever over 100.4 F (38.0 C) * You become worse REDUCING THE ITCH - CALAMINE LOTION: DON'T SCRATCH: SEE PCP WITHIN 24 HOURS: Referrals REFERRED TO PCP OFFICE

## 2020-08-16 NOTE — Telephone Encounter (Signed)
Per appt notes pt already has appt with Dr Silvio Pate 08/16/20 at 3:15.

## 2020-08-21 ENCOUNTER — Other Ambulatory Visit: Payer: Self-pay | Admitting: Internal Medicine

## 2020-08-24 DIAGNOSIS — Q248 Other specified congenital malformations of heart: Secondary | ICD-10-CM | POA: Diagnosis not present

## 2020-08-25 DIAGNOSIS — H353221 Exudative age-related macular degeneration, left eye, with active choroidal neovascularization: Secondary | ICD-10-CM | POA: Diagnosis not present

## 2020-09-01 DIAGNOSIS — H353211 Exudative age-related macular degeneration, right eye, with active choroidal neovascularization: Secondary | ICD-10-CM | POA: Diagnosis not present

## 2020-09-06 MED ORDER — ATORVASTATIN CALCIUM 40 MG PO TABS: 40.0000 mg | ORAL_TABLET | Freq: Every day | ORAL | 3 refills | Status: DC

## 2020-09-06 NOTE — Telephone Encounter (Signed)
Im pretty sure the answer will be yes. It was originally written by the hospitalist in March. Will forward to Dr Silvio Pate just to verify.

## 2020-09-17 ENCOUNTER — Encounter: Payer: Self-pay | Admitting: Internal Medicine

## 2020-09-17 ENCOUNTER — Ambulatory Visit (INDEPENDENT_AMBULATORY_CARE_PROVIDER_SITE_OTHER): Payer: Medicare Other | Admitting: Internal Medicine

## 2020-09-17 ENCOUNTER — Other Ambulatory Visit: Payer: Self-pay

## 2020-09-17 VITALS — BP 130/74 | HR 56 | Temp 97.2°F | Ht 61.0 in | Wt 144.0 lb

## 2020-09-17 DIAGNOSIS — Z8673 Personal history of transient ischemic attack (TIA), and cerebral infarction without residual deficits: Secondary | ICD-10-CM | POA: Diagnosis not present

## 2020-09-17 DIAGNOSIS — H353230 Exudative age-related macular degeneration, bilateral, stage unspecified: Secondary | ICD-10-CM | POA: Diagnosis not present

## 2020-09-17 DIAGNOSIS — E785 Hyperlipidemia, unspecified: Secondary | ICD-10-CM | POA: Diagnosis not present

## 2020-09-17 LAB — LIPID PANEL
Cholesterol: 151 mg/dL (ref 0–200)
HDL: 46.3 mg/dL (ref >39.00)
LDL Cholesterol: 76 mg/dL (ref 0–99)
NonHDL: 104.98
Total CHOL/HDL Ratio: 3
Triglycerides: 146 mg/dL (ref 0.0–149.0)
VLDL: 29.2 mg/dL (ref 0.0–40.0)

## 2020-09-17 LAB — COMPREHENSIVE METABOLIC PANEL
ALT: 11 U/L (ref 0–35)
AST: 13 U/L (ref 0–37)
Albumin: 4 g/dL (ref 3.5–5.2)
Alkaline Phosphatase: 75 U/L (ref 39–117)
BUN: 13 mg/dL (ref 6–23)
CO2: 29 mEq/L (ref 19–32)
Calcium: 9.3 mg/dL (ref 8.4–10.5)
Chloride: 106 mEq/L (ref 96–112)
Creatinine, Ser: 1.29 mg/dL — ABNORMAL HIGH (ref 0.40–1.20)
GFR: 37.93 mL/min — ABNORMAL LOW (ref >60.00)
Glucose, Bld: 90 mg/dL (ref 70–99)
Potassium: 3.9 mEq/L (ref 3.5–5.1)
Sodium: 142 mEq/L (ref 135–145)
Total Bilirubin: 0.9 mg/dL (ref 0.2–1.2)
Total Protein: 6.5 g/dL (ref 6.0–8.3)

## 2020-09-17 NOTE — Assessment & Plan Note (Signed)
Continues on injections in both eyes

## 2020-09-17 NOTE — Progress Notes (Signed)
Subjective:    Patient ID: Sherry Phillips, female    DOB: Jul 01, 1933, 87 y.o.   MRN: [id number]  HPI Here for follow up of apparent TIA This visit occurred during the SARS-CoV-2 public health emergency.  Safety protocols were in place, including screening questions prior to the visit, additional usage of staff PPE, and extensive cleaning of exam room while observing appropriate contact time as indicated for disinfecting solutions.   No further spells No neurologic symptoms--such as focal weakness, aphasia, facial droop, etc No palpitations No trouble tolerating the statin Continues on the ASA  Current Outpatient Medications on File Prior to Visit  Medication Sig Dispense Refill   aspirin EC 81 MG EC tablet Take 1 tablet (81 mg total) by mouth daily. Swallow whole. 30 tablet 11   atorvastatin (LIPITOR) 40 MG tablet Take 1 tablet (40 mg total) by mouth daily. 90 tablet 3   famotidine (PEPCID) 20 MG tablet Take 20 mg by mouth 2 (two) times daily.     fexofenadine (ALLEGRA) 180 MG tablet Take 180 mg by mouth daily as needed.      Multiple Vitamins-Minerals (PRESERVISION AREDS 2) CAPS Take by mouth daily.     vitamin B-12 (CYANOCOBALAMIN) 1000 MCG tablet Take 1,000 mcg by mouth every other day.     No current facility-administered medications on file prior to visit.    Allergies  Allergen Reactions   Egg Donia Pounds, Egg] Rash    Past Medical History:  Diagnosis Date   GERD (gastroesophageal reflux disease)    Hiatal hernia    Hyperlipidemia    Osteoarthritis     Past Surgical History:  Procedure Laterality Date   APPENDECTOMY  1962   with ruptured ovary repair   BREAST BIOPSY  1970   CATARACT EXTRACTION     both eyes   ROBOTIC ASSISTED LAPAROSCOPIC REPAIR OF PARAESOPHAGEAL HERNIA  01/2017   Dr Grandville Silos at [facility] History  Problem Relation Age of Onset   Hypertension Mother    Leukemia Mother    COPD Father    Diabetes Sister        borderline    Coronary artery disease Brother    Lung disease Brother    Cancer Neg Hx        no breast or colon cancer    Social History   Socioeconomic History   Marital status: Widowed    Spouse name: Not on file   Number of children: 2   Years of education: Not on file   Highest education level: Not on file  Occupational History   Occupation: homemaker  Tobacco Use   Smoking status: Never   Smokeless tobacco: Never  Vaping Use   Vaping Use: Never used  Substance and Sexual Activity   Alcohol use: No   Drug use: No   Sexual activity: Never  Other Topics Concern   Not on file  Social History Narrative   Widowed 77      One son and one daughter, [name]   Has living will.  Requests daughter to make decisions if needed.    Would accept CPR at this point, but no prolonged artificial ventilation.     Would not want tube feeds.   Social Determinants of Health   Financial Resource Strain: Not on file  Food Insecurity: Not on file  Transportation Needs: Not on file  Physical Activity: Not on file  Stress: Not on file  Social Connections: Not on  file  Intimate Partner Violence: Not on file   Review of Systems Sleeps well Appetite is good No N/V Continues on injections OU for AMD     Objective:   Physical Exam Constitutional:      Appearance: Normal appearance.  Cardiovascular:     Rate and Rhythm: Normal rate and regular rhythm.     Heart sounds: No murmur heard.   No gallop.  Pulmonary:     Effort: Pulmonary effort is normal.     Breath sounds: Normal breath sounds. No wheezing or rales.  Musculoskeletal:     Cervical back: Neck supple.     Right lower leg: No edema.     Left lower leg: No edema.  Lymphadenopathy:     Cervical: No cervical adenopathy.  Neurological:     General: No focal deficit present.     Mental Status: She is alert.           Assessment & Plan:

## 2020-09-17 NOTE — Assessment & Plan Note (Signed)
Vascular imaging benign Suspicion of hypertrophic cardiomyopathy on echo---cardiology evaluation did not think this made sense (since not there in 2018) and will just recheck 1 year No symptoms of a fib--she didn't want monitor  Will continue atorvastatin/ASA

## 2020-09-17 NOTE — Assessment & Plan Note (Signed)
On statin.

## 2020-10-27 DIAGNOSIS — H353211 Exudative age-related macular degeneration, right eye, with active choroidal neovascularization: Secondary | ICD-10-CM | POA: Diagnosis not present

## 2020-10-27 DIAGNOSIS — H43813 Vitreous degeneration, bilateral: Secondary | ICD-10-CM | POA: Diagnosis not present

## 2020-10-27 DIAGNOSIS — E113393 Type 2 diabetes mellitus with moderate nonproliferative diabetic retinopathy without macular edema, bilateral: Secondary | ICD-10-CM | POA: Diagnosis not present

## 2020-11-03 DIAGNOSIS — H353221 Exudative age-related macular degeneration, left eye, with active choroidal neovascularization: Secondary | ICD-10-CM | POA: Diagnosis not present

## 2020-12-15 DIAGNOSIS — H43813 Vitreous degeneration, bilateral: Secondary | ICD-10-CM | POA: Diagnosis not present

## 2020-12-22 DIAGNOSIS — H353211 Exudative age-related macular degeneration, right eye, with active choroidal neovascularization: Secondary | ICD-10-CM | POA: Diagnosis not present

## 2021-01-12 DIAGNOSIS — H353221 Exudative age-related macular degeneration, left eye, with active choroidal neovascularization: Secondary | ICD-10-CM | POA: Diagnosis not present

## 2021-02-16 DIAGNOSIS — H353211 Exudative age-related macular degeneration, right eye, with active choroidal neovascularization: Secondary | ICD-10-CM | POA: Diagnosis not present

## 2021-03-23 DIAGNOSIS — H353221 Exudative age-related macular degeneration, left eye, with active choroidal neovascularization: Secondary | ICD-10-CM | POA: Diagnosis not present

## 2021-04-25 DIAGNOSIS — H903 Sensorineural hearing loss, bilateral: Secondary | ICD-10-CM | POA: Diagnosis not present

## 2021-04-27 DIAGNOSIS — H353221 Exudative age-related macular degeneration, left eye, with active choroidal neovascularization: Secondary | ICD-10-CM | POA: Diagnosis not present

## 2021-06-08 DIAGNOSIS — H353221 Exudative age-related macular degeneration, left eye, with active choroidal neovascularization: Secondary | ICD-10-CM | POA: Diagnosis not present

## 2021-07-13 DIAGNOSIS — H353211 Exudative age-related macular degeneration, right eye, with active choroidal neovascularization: Secondary | ICD-10-CM | POA: Diagnosis not present

## 2021-08-10 ENCOUNTER — Ambulatory Visit (INDEPENDENT_AMBULATORY_CARE_PROVIDER_SITE_OTHER): Payer: Medicare Other | Admitting: Internal Medicine

## 2021-08-10 ENCOUNTER — Encounter: Payer: Self-pay | Admitting: Internal Medicine

## 2021-08-10 VITALS — BP 116/78 | HR 76 | Ht 61.0 in | Wt 147.2 lb

## 2021-08-10 DIAGNOSIS — R600 Localized edema: Secondary | ICD-10-CM | POA: Diagnosis not present

## 2021-08-10 DIAGNOSIS — E785 Hyperlipidemia, unspecified: Secondary | ICD-10-CM

## 2021-08-10 DIAGNOSIS — K219 Gastro-esophageal reflux disease without esophagitis: Secondary | ICD-10-CM | POA: Diagnosis not present

## 2021-08-10 DIAGNOSIS — S335XXA Sprain of ligaments of lumbar spine, initial encounter: Secondary | ICD-10-CM | POA: Diagnosis not present

## 2021-08-10 LAB — COMPREHENSIVE METABOLIC PANEL
ALT: 10 U/L (ref 0–35)
AST: 13 U/L (ref 0–37)
Albumin: 4.2 g/dL (ref 3.5–5.2)
Alkaline Phosphatase: 115 U/L (ref 39–117)
BUN: 16 mg/dL (ref 6–23)
CO2: 31 mEq/L (ref 19–32)
Calcium: 9.8 mg/dL (ref 8.4–10.5)
Chloride: 104 mEq/L (ref 96–112)
Creatinine, Ser: 1.2 mg/dL (ref 0.40–1.20)
GFR: 41.11 mL/min — ABNORMAL LOW (ref >60.00)
Glucose, Bld: 94 mg/dL (ref 70–99)
Potassium: 4.7 mEq/L (ref 3.5–5.1)
Sodium: 143 mEq/L (ref 135–145)
Total Bilirubin: 0.6 mg/dL (ref 0.2–1.2)
Total Protein: 6.7 g/dL (ref 6.0–8.3)

## 2021-08-10 LAB — LIPID PANEL
Cholesterol: 202 mg/dL — ABNORMAL HIGH (ref 0–200)
HDL: 44.2 mg/dL (ref >39.00)
LDL Cholesterol: 127 mg/dL — ABNORMAL HIGH (ref 0–99)
NonHDL: 157.56
Total CHOL/HDL Ratio: 5
Triglycerides: 153 mg/dL — ABNORMAL HIGH (ref 0.0–149.0)
VLDL: 30.6 mg/dL (ref 0.0–40.0)

## 2021-08-10 LAB — CBC
HCT: 41.6 % (ref 36.0–46.0)
Hemoglobin: 13.7 g/dL (ref 12.0–15.0)
MCHC: 33.1 g/dL (ref 30.0–36.0)
MCV: 89.6 fl (ref 78.0–100.0)
Platelets: 261 10*3/uL (ref 150.0–400.0)
RBC: 4.64 Mil/uL (ref 3.87–5.11)
RDW: 14.4 % (ref 11.5–15.5)
WBC: 8.1 10*3/uL (ref 4.0–10.5)

## 2021-08-10 LAB — T4, FREE: Free T4: 0.88 ng/dL (ref 0.60–1.60)

## 2021-08-10 MED ORDER — TIZANIDINE HCL 2 MG PO TABS: 2.0000 mg | ORAL_TABLET | Freq: Every evening | ORAL | 0 refills | Status: DC | PRN

## 2021-08-10 NOTE — Patient Instructions (Signed)
Please increase the tylenol to two '500mg'$  tabs three times a day ?You can try over the counter diclofenac gel on your back up to three times a day. ?Use the muscle relaxer (tizanidine prescription) to help you sleep. Be careful when getting up after taking this ?

## 2021-08-10 NOTE — Progress Notes (Signed)
? ?Subjective:  ? ? Patient ID: Sherry Phillips, female    DOB: [birth year], 88 y.o.   MRN: [id number] ? ?HPI ?Here with several concerns--back pain, leg swelling, etc ? ?Pulled something in middle to right low back ?Was picking up heavy pots of dirt ?1 week ago---felt something when coming up from bending ?Continued her work--but had increasing pain over the week ?Keeping her up at night ?No radiation to legs ?Better if she is moving around and walking ?No change in mild leg weakness ?Taking tylenol '500mg'$  every few hours (8) ? ?Some edema in feet yesterday ?First time ever ?Did improve by morning ?Does eat a lot of potato chips and salts food ?No chest pain ?No SOB ?No dizziness or syncope ? ?Wonders about her cholesterol levels ?Has cut down to 2 days per week---and that keeps her legs from aching ? ?Current Outpatient Medications on File Prior to Visit  ?Medication Sig Dispense Refill  ? aspirin EC 81 MG EC tablet Take 1 tablet (81 mg total) by mouth daily. Swallow whole. 30 tablet 11  ? famotidine (PEPCID) 20 MG tablet Take 20 mg by mouth 2 (two) times daily.    ? fexofenadine (ALLEGRA) 180 MG tablet Take 180 mg by mouth daily as needed.     ? Multiple Vitamins-Minerals (PRESERVISION AREDS 2) CAPS Take by mouth daily.    ? vitamin B-12 (CYANOCOBALAMIN) 1000 MCG tablet Take 1,000 mcg by mouth every other day.    ? atorvastatin (LIPITOR) 40 MG tablet Take 1 tablet (40 mg total) by mouth daily. 90 tablet 3  ? ?No current facility-administered medications on file prior to visit.  ? ? ?Allergies  ?Allergen Reactions  ? Egg White [Egg White (Egg Protein)] Rash  ? ? ?Past Medical History:  ?Diagnosis Date  ? GERD (gastroesophageal reflux disease)   ? Hiatal hernia   ? Hyperlipidemia   ? Osteoarthritis   ? ? ?Past Surgical History:  ?Procedure Laterality Date  ? APPENDECTOMY  1962  ? with ruptured ovary repair  ? BREAST BIOPSY  1970  ? CATARACT EXTRACTION    ? both eyes  ? ROBOTIC ASSISTED LAPAROSCOPIC REPAIR OF  PARAESOPHAGEAL HERNIA  01/2017  ? Dr Grandville Silos at [facility]  ? ? ?Family History  ?Problem Relation Age of Onset  ? Hypertension Mother   ? Leukemia Mother   ? COPD Father   ? Diabetes Sister   ?     borderline  ? Coronary artery disease Brother   ? Lung disease Brother   ? Cancer Neg Hx   ?     no breast or colon cancer  ? ? ?Social History  ? ?Socioeconomic History  ? Marital status: Widowed  ?  Spouse name: Not on file  ? Number of children: 2  ? Years of education: Not on file  ? Highest education level: Not on file  ?Occupational History  ? Occupation: homemaker  ?Tobacco Use  ? Smoking status: Never  ? Smokeless tobacco: Never  ?Vaping Use  ? Vaping Use: Never used  ?Substance and Sexual Activity  ? Alcohol use: No  ? Drug use: No  ? Sexual activity: Never  ?Other Topics Concern  ? Not on file  ?Social History Narrative  ? Widowed 2015  ?   ? One son and one daughter, [name]  ? Has living will.  Requests daughter to make decisions if needed.   ? Would accept CPR at this point, but no prolonged artificial ventilation.    ?  Would not want tube feeds.  ? ?Social Determinants of Health  ? ?Financial Resource Strain: Not on file  ?Food Insecurity: Not on file  ?Transportation Needs: Not on file  ?Physical Activity: Not on file  ?Stress: Not on file  ?Social Connections: Not on file  ?Intimate Partner Violence: Not on file  ? ?Review of Systems ?Eating fine ?Weight stable ?No heartburn on daily famotidine. No trouble swallowing ?Continues on injections for macular degeneration ? ?   ?Objective:  ? Physical Exam ?Constitutional:   ?   Appearance: Normal appearance.  ?Cardiovascular:  ?   Rate and Rhythm: Normal rate and regular rhythm.  ?   Heart sounds: No murmur heard. ?  No gallop.  ?Pulmonary:  ?   Effort: Pulmonary effort is normal.  ?   Breath sounds: Normal breath sounds. No wheezing or rales.  ?Musculoskeletal:  ?   Cervical back: Neck supple.  ?   Comments: Slight ankle edema ?No pitting ?Mild lumbar  tenderness ?SLR negative ?Back flexion pretty normal ?ROM in hips is normal  ?Lymphadenopathy:  ?   Cervical: No cervical adenopathy.  ?Neurological:  ?   Mental Status: She is alert.  ?   Comments: Normal gait ?Leg strength 4-4+/5 --symmetric  ?Psychiatric:     ?   Mood and Affect: Mood normal.     ?   Behavior: Behavior normal.  ?  ? ? ? ? ?   ?Assessment & Plan:  ? ?

## 2021-08-10 NOTE — Assessment & Plan Note (Signed)
After lifting heavy pot ?Reassured---no worrisome findings ?Discussed increasing tylenol ?Topical diclofenac ?Will give tizanidine 2-'4mg'$  at bedtime ?Continue heat ?

## 2021-08-10 NOTE — Assessment & Plan Note (Signed)
Does well with the famotidine 20 daily ?

## 2021-08-10 NOTE — Assessment & Plan Note (Signed)
Has tolerated the atorvastatin '40mg'$  twice a week ?

## 2021-08-10 NOTE — Assessment & Plan Note (Signed)
Nothing to suggest CHF ?Will check chemistries ?Likely just too much salt--discussed ?Recommended support hose if going to be up all day ?

## 2021-08-17 DIAGNOSIS — H353221 Exudative age-related macular degeneration, left eye, with active choroidal neovascularization: Secondary | ICD-10-CM | POA: Diagnosis not present

## 2021-09-14 DIAGNOSIS — H353211 Exudative age-related macular degeneration, right eye, with active choroidal neovascularization: Secondary | ICD-10-CM | POA: Diagnosis not present

## 2021-10-26 DIAGNOSIS — H353221 Exudative age-related macular degeneration, left eye, with active choroidal neovascularization: Secondary | ICD-10-CM | POA: Diagnosis not present

## 2021-10-29 DIAGNOSIS — M1990 Unspecified osteoarthritis, unspecified site: Secondary | ICD-10-CM | POA: Diagnosis not present

## 2021-10-29 DIAGNOSIS — Z79899 Other long term (current) drug therapy: Secondary | ICD-10-CM | POA: Diagnosis not present

## 2021-10-29 DIAGNOSIS — K625 Hemorrhage of anus and rectum: Secondary | ICD-10-CM | POA: Diagnosis not present

## 2021-10-29 DIAGNOSIS — Z7982 Long term (current) use of aspirin: Secondary | ICD-10-CM | POA: Diagnosis not present

## 2021-10-29 DIAGNOSIS — K219 Gastro-esophageal reflux disease without esophagitis: Secondary | ICD-10-CM | POA: Diagnosis not present

## 2021-10-29 DIAGNOSIS — R768 Other specified abnormal immunological findings in serum: Secondary | ICD-10-CM | POA: Diagnosis not present

## 2021-10-29 DIAGNOSIS — M810 Age-related osteoporosis without current pathological fracture: Secondary | ICD-10-CM | POA: Diagnosis not present

## 2021-10-30 DIAGNOSIS — R768 Other specified abnormal immunological findings in serum: Secondary | ICD-10-CM | POA: Diagnosis not present

## 2021-11-08 ENCOUNTER — Encounter: Payer: Self-pay | Admitting: Internal Medicine

## 2021-11-08 ENCOUNTER — Ambulatory Visit (INDEPENDENT_AMBULATORY_CARE_PROVIDER_SITE_OTHER): Payer: Medicare Other | Admitting: Internal Medicine

## 2021-11-08 DIAGNOSIS — K625 Hemorrhage of anus and rectum: Secondary | ICD-10-CM | POA: Diagnosis not present

## 2021-11-08 NOTE — Progress Notes (Signed)
Subjective:    Patient ID: Sherry Phillips, female    DOB: [birth year], 88 y.o.   MRN: [id number]  HPI Here for ER follow up Had rectal bleeding  Sudden red blood with her bowel movement-- 8/6 in the morning Stool Mierzejewski but blood mixed in and in the bowl Started with dark blood--and then more light red Had felt normal before this Only happened once Son took her to ER Hemoglobin normal Sent home  Only saw blood one more time---3-4 days later (just a little)  No abdominal pain Current Outpatient Medications on File Prior to Visit  Medication Sig Dispense Refill   aspirin EC 81 MG EC tablet Take 1 tablet (81 mg total) by mouth daily. Swallow whole. 30 tablet 11   famotidine (PEPCID) 20 MG tablet Take 20 mg by mouth 2 (two) times daily.     fexofenadine (ALLEGRA) 180 MG tablet Take 180 mg by mouth daily as needed.      Multiple Vitamins-Minerals (PRESERVISION AREDS 2) CAPS Take by mouth daily.     tiZANidine (ZANAFLEX) 2 MG tablet Take 1-2 tablets (2-4 mg total) by mouth at bedtime as needed for muscle spasms. 30 tablet 0   vitamin B-12 (CYANOCOBALAMIN) 1000 MCG tablet Take 1,000 mcg by mouth every other day.     atorvastatin (LIPITOR) 40 MG tablet Take 1 tablet (40 mg total) by mouth daily. 90 tablet 3   No current facility-administered medications on file prior to visit.    Allergies  Allergen Reactions   Egg White [Egg White (Egg Protein)] Rash    Past Medical History:  Diagnosis Date   GERD (gastroesophageal reflux disease)    Hiatal hernia    Hyperlipidemia    Osteoarthritis     Past Surgical History:  Procedure Laterality Date   APPENDECTOMY  1962   with ruptured ovary repair   BREAST BIOPSY  1970   CATARACT EXTRACTION     both eyes   ROBOTIC ASSISTED LAPAROSCOPIC REPAIR OF PARAESOPHAGEAL HERNIA  01/2017   Dr Grandville Silos at [facility] History  Problem Relation Age of Onset   Hypertension Mother    Leukemia Mother    COPD Father    Diabetes  Sister        borderline   Coronary artery disease Brother    Lung disease Brother    Cancer Neg Hx        no breast or colon cancer    Social History   Socioeconomic History   Marital status: Widowed    Spouse name: Not on file   Number of children: 2   Years of education: Not on file   Highest education level: Not on file  Occupational History   Occupation: homemaker  Tobacco Use   Smoking status: Never   Smokeless tobacco: Never  Vaping Use   Vaping Use: Never used  Substance and Sexual Activity   Alcohol use: No   Drug use: No   Sexual activity: Never  Other Topics Concern   Not on file  Social History Narrative   Widowed 87      One son and one daughter, [name]   Has living will.  Requests daughter to make decisions if needed.    Would accept CPR at this point, but no prolonged artificial ventilation.     Would not want tube feeds.   Social Determinants of Health   Financial Resource Strain: Not on file  Food Insecurity: Not on file  Transportation Needs: Not on file  Physical Activity: Not on file  Stress: Not on file  Social Connections: Not on file  Intimate Partner Violence: Not on file   Review of Systems Appetite is fine Weight stable No N/V Still works outside a lot External hemorrhoids in the past--not for some time    Objective:   Physical Exam Constitutional:      Appearance: Normal appearance.  Abdominal:     Palpations: Abdomen is soft.     Tenderness: There is no abdominal tenderness.  Neurological:     Mental Status: She is alert.            Assessment & Plan:

## 2021-11-08 NOTE — Assessment & Plan Note (Addendum)
The significant bleed without other symptoms is highly suggestive of diverticular bleed Not really consistent with hemorrhoidal bleed  Discussed consultation and consideration of colonoscopy I believe the chance of neoplasm is small--but certainly not non existent She prefers to hold off on GI consultation---I think that is okay (unless she passes more blood) Will just recheck CBC when she comes back for a wellness visit

## 2021-11-09 DIAGNOSIS — H353211 Exudative age-related macular degeneration, right eye, with active choroidal neovascularization: Secondary | ICD-10-CM | POA: Diagnosis not present

## 2021-12-01 ENCOUNTER — Ambulatory Visit (INDEPENDENT_AMBULATORY_CARE_PROVIDER_SITE_OTHER): Payer: Medicare Other | Admitting: Family Medicine

## 2021-12-01 VITALS — BP 142/70 | HR 94 | Temp 97.7°F | Ht 61.0 in | Wt 144.2 lb

## 2021-12-01 DIAGNOSIS — W57XXXA Bitten or stung by nonvenomous insect and other nonvenomous arthropods, initial encounter: Secondary | ICD-10-CM | POA: Insufficient documentation

## 2021-12-01 DIAGNOSIS — S70361A Insect bite (nonvenomous), right thigh, initial encounter: Secondary | ICD-10-CM

## 2021-12-01 NOTE — Progress Notes (Signed)
Patient ID: Sherry Phillips, female    DOB: 1933/11/26, 88 y.o.   MRN: [id number]  This visit was conducted in person.  BP (!) 142/70   Pulse 94   Temp 97.7 F (36.5 C) (Temporal)   Ht '5\' 1"'$  (1.549 m)   Wt 144 lb 4 oz (65.4 kg)   SpO2 97%   BMI 27.26 kg/m    CC:  Chief Complaint  Patient presents with   Insect Bite    Tick. Pulled off 3 days ago. R inner thigh    Subjective:   HPI: Sherry Phillips is a 88 y.o. female presenting on 12/01/2021 for Insect Bite (Tick. Pulled off 3 days ago. R inner thigh)   She reports she found tick on her right upper thigh. Found it 3 days ago... unknown when attached.  Area was itchy.   Removed tick but had some issue getting all of it.  Minimal redness, minimal itching.   No fever, chills, flu like symptoms.  No headache or neck stiffness.  No rash.        Relevant past medical, surgical, family and social history reviewed and updated as indicated. Interim medical history since our last visit reviewed. Allergies and medications reviewed and updated. Outpatient Medications Prior to Visit  Medication Sig Dispense Refill   aspirin EC 81 MG EC tablet Take 1 tablet (81 mg total) by mouth daily. Swallow whole. 30 tablet 11   famotidine (PEPCID) 20 MG tablet Take 20 mg by mouth 2 (two) times daily.     fexofenadine (ALLEGRA) 180 MG tablet Take 180 mg by mouth daily as needed.      Multiple Vitamins-Minerals (PRESERVISION AREDS 2) CAPS Take by mouth daily.     tiZANidine (ZANAFLEX) 2 MG tablet Take 1-2 tablets (2-4 mg total) by mouth at bedtime as needed for muscle spasms. 30 tablet 0   vitamin B-12 (CYANOCOBALAMIN) 1000 MCG tablet Take 1,000 mcg by mouth every other day.     atorvastatin (LIPITOR) 40 MG tablet Take 1 tablet (40 mg total) by mouth daily. 90 tablet 3   No facility-administered medications prior to visit.     Per HPI unless specifically indicated in ROS section below Review of Systems  Constitutional:   Negative for fatigue and fever.  HENT:  Negative for congestion.   Eyes:  Negative for pain.  Respiratory:  Negative for cough and shortness of breath.   Cardiovascular:  Negative for chest pain, palpitations and leg swelling.  Gastrointestinal:  Negative for abdominal pain.  Genitourinary:  Negative for dysuria and vaginal bleeding.  Musculoskeletal:  Negative for back pain.  Neurological:  Negative for syncope, light-headedness and headaches.  Psychiatric/Behavioral:  Negative for dysphoric mood.    Objective:  BP (!) 142/70   Pulse 94   Temp 97.7 F (36.5 C) (Temporal)   Ht '5\' 1"'$  (1.549 m)   Wt 144 lb 4 oz (65.4 kg)   SpO2 97%   BMI 27.26 kg/m   Wt Readings from Last 3 Encounters:  12/01/21 144 lb 4 oz (65.4 kg)  11/08/21 144 lb (65.3 kg)  08/10/21 147 lb 3.2 oz (66.8 kg)      Physical Exam Constitutional:      General: She is not in acute distress.    Appearance: Normal appearance. She is well-developed. She is not ill-appearing or toxic-appearing.  HENT:     Head: Normocephalic.     Right Ear: Hearing, tympanic membrane, ear canal and external ear normal.  Tympanic membrane is not erythematous, retracted or bulging.     Left Ear: Hearing, tympanic membrane, ear canal and external ear normal. Tympanic membrane is not erythematous, retracted or bulging.     Nose: No mucosal edema or rhinorrhea.     Right Sinus: No maxillary sinus tenderness or frontal sinus tenderness.     Left Sinus: No maxillary sinus tenderness or frontal sinus tenderness.     Mouth/Throat:     Pharynx: Uvula midline.  Eyes:     General: Lids are normal. Lids are everted, no foreign bodies appreciated.     Conjunctiva/sclera: Conjunctivae normal.     Pupils: Pupils are equal, round, and reactive to light.  Neck:     Thyroid: No thyroid mass or thyromegaly.     Vascular: No carotid bruit.     Trachea: Trachea normal.  Cardiovascular:     Rate and Rhythm: Normal rate and regular rhythm.      Pulses: Normal pulses.     Heart sounds: Normal heart sounds, S1 normal and S2 normal. No murmur heard.    No friction rub. No gallop.  Pulmonary:     Effort: Pulmonary effort is normal. No tachypnea or respiratory distress.     Breath sounds: Normal breath sounds. No decreased breath sounds, wheezing, rhonchi or rales.  Abdominal:     General: Bowel sounds are normal.     Palpations: Abdomen is soft.     Tenderness: There is no abdominal tenderness.  Musculoskeletal:     Cervical back: Normal range of motion and neck supple.  Skin:    General: Skin is warm and dry.     Findings: No rash.     Comments: Right upper thigh, inner  Small scab, no associated erythema or heat.  Neurological:     Mental Status: She is alert.  Psychiatric:        Mood and Affect: Mood is not anxious or depressed.        Speech: Speech normal.        Behavior: Behavior normal. Behavior is cooperative.        Thought Content: Thought content normal.        Judgment: Judgment normal.       Results for orders placed or performed in visit on 08/10/21  Comprehensive metabolic panel  Result Value Ref Range   Sodium 143 135 - 145 mEq/L   Potassium 4.7 3.5 - 5.1 mEq/L   Chloride 104 96 - 112 mEq/L   CO2 31 19 - 32 mEq/L   Glucose, Bld 94 70 - 99 mg/dL   BUN 16 6 - 23 mg/dL   Creatinine, Ser 1.20 0.40 - 1.20 mg/dL   Total Bilirubin 0.6 0.2 - 1.2 mg/dL   Alkaline Phosphatase 115 39 - 117 U/L   AST 13 0 - 37 U/L   ALT 10 0 - 35 U/L   Total Protein 6.7 6.0 - 8.3 g/dL   Albumin 4.2 3.5 - 5.2 g/dL   GFR 41.11 (L) >60.00 mL/min   Calcium 9.8 8.4 - 10.5 mg/dL  T4, free  Result Value Ref Range   Free T4 0.88 0.60 - 1.60 ng/dL  Lipid panel  Result Value Ref Range   Cholesterol 202 (H) 0 - 200 mg/dL   Triglycerides 153.0 (H) 0.0 - 149.0 mg/dL   HDL 44.20 >39.00 mg/dL   VLDL 30.6 0.0 - 40.0 mg/dL   LDL Cholesterol 127 (H) 0 - 99 mg/dL   Total CHOL/HDL Ratio 5  NonHDL 157.56   CBC  Result Value Ref  Range   WBC 8.1 4.0 - 10.5 K/uL   RBC 4.64 3.87 - 5.11 Mil/uL   Platelets 261.0 150.0 - 400.0 K/uL   Hemoglobin 13.7 12.0 - 15.0 g/dL   HCT 41.6 36.0 - 46.0 %   MCV 89.6 78.0 - 100.0 fl   MCHC 33.1 30.0 - 36.0 g/dL   RDW 14.4 11.5 - 15.5 %     COVID 19 screen:  No recent travel or known exposure to COVID19 The patient denies respiratory symptoms of COVID 19 at this time. The importance of social distancing was discussed today.   Assessment and Plan    Problem List Items Addressed This Visit     Tick bite of right thigh - Primary     Acute  Scab at site, appropriate healing.  No suggestion of tickborne illness but discussed with patient that it is too early to see signs of this.  She was given information for return or ER precautions.  She can apply topical steroid cream as anti-inflammatory at the site to help with itching and speed healing.        Eliezer Lofts, MD

## 2021-12-01 NOTE — Assessment & Plan Note (Signed)
Acute  Scab at site, appropriate healing.  No suggestion of tickborne illness but discussed with patient that it is too early to see signs of this.  She was given information for return or ER precautions.  She can apply topical steroid cream as anti-inflammatory at the site to help with itching and speed healing.

## 2021-12-01 NOTE — Patient Instructions (Addendum)
No suggestion of infection.  Apply topical over-the-counter cortisone 10 cream twice daily as needed for itching. Keep an eye out for tick borne illness symptoms after 1- 2 weeks.

## 2021-12-21 DIAGNOSIS — H353221 Exudative age-related macular degeneration, left eye, with active choroidal neovascularization: Secondary | ICD-10-CM | POA: Diagnosis not present

## 2022-01-05 DIAGNOSIS — Q828 Other specified congenital malformations of skin: Secondary | ICD-10-CM | POA: Diagnosis not present

## 2022-01-18 DIAGNOSIS — H353211 Exudative age-related macular degeneration, right eye, with active choroidal neovascularization: Secondary | ICD-10-CM | POA: Diagnosis not present

## 2022-02-22 DIAGNOSIS — H353221 Exudative age-related macular degeneration, left eye, with active choroidal neovascularization: Secondary | ICD-10-CM | POA: Diagnosis not present

## 2022-03-08 ENCOUNTER — Encounter: Payer: Self-pay | Admitting: Internal Medicine

## 2022-03-08 ENCOUNTER — Ambulatory Visit (INDEPENDENT_AMBULATORY_CARE_PROVIDER_SITE_OTHER): Payer: Medicare Other | Admitting: Internal Medicine

## 2022-03-08 VITALS — BP 110/72 | HR 84 | Temp 98.0°F | Ht 61.0 in | Wt 143.0 lb

## 2022-03-08 DIAGNOSIS — N1832 Chronic kidney disease, stage 3b: Secondary | ICD-10-CM | POA: Diagnosis not present

## 2022-03-08 DIAGNOSIS — K2100 Gastro-esophageal reflux disease with esophagitis, without bleeding: Secondary | ICD-10-CM | POA: Diagnosis not present

## 2022-03-08 DIAGNOSIS — E538 Deficiency of other specified B group vitamins: Secondary | ICD-10-CM | POA: Diagnosis not present

## 2022-03-08 DIAGNOSIS — H353230 Exudative age-related macular degeneration, bilateral, stage unspecified: Secondary | ICD-10-CM

## 2022-03-08 DIAGNOSIS — Z8673 Personal history of transient ischemic attack (TIA), and cerebral infarction without residual deficits: Secondary | ICD-10-CM | POA: Diagnosis not present

## 2022-03-08 DIAGNOSIS — R319 Hematuria, unspecified: Secondary | ICD-10-CM | POA: Insufficient documentation

## 2022-03-08 DIAGNOSIS — Z0000 Encounter for general adult medical examination without abnormal findings: Secondary | ICD-10-CM | POA: Diagnosis not present

## 2022-03-08 DIAGNOSIS — R310 Gross hematuria: Secondary | ICD-10-CM | POA: Diagnosis not present

## 2022-03-08 LAB — POCT URINALYSIS DIPSTICK
Bilirubin, UA: NEGATIVE
Blood, UA: NEGATIVE
Glucose, UA: NEGATIVE
Ketones, UA: NEGATIVE
Leukocytes, UA: NEGATIVE
Nitrite, UA: NEGATIVE
Protein, UA: NEGATIVE
Spec Grav, UA: <=1.005 — AB (ref 1.010–1.025)
Urobilinogen, UA: 0.2 E.U./dL
pH, UA: 6 (ref 5.0–8.0)

## 2022-03-08 MED ORDER — TRIAMCINOLONE ACETONIDE 0.1 % EX CREA: 1.0000 | TOPICAL_CREAM | Freq: Two times a day (BID) | CUTANEOUS | 1 refills | Status: AC | PRN

## 2022-03-08 NOTE — Assessment & Plan Note (Signed)
Getting injections in both eyes

## 2022-03-08 NOTE — Assessment & Plan Note (Addendum)
Thinks she had urology work up in [place] about a year ago--but I can't find it Will check urine Urinalysis completely normal Discussed letting me know if this recurs--will set back up with the urologist

## 2022-03-08 NOTE — Assessment & Plan Note (Signed)
Will recheck labs 

## 2022-03-08 NOTE — Assessment & Plan Note (Signed)
Continues on the B12

## 2022-03-08 NOTE — Assessment & Plan Note (Signed)
Taking the aspirin daily and atorvastatin twice a week No new neurologic symptoms

## 2022-03-08 NOTE — Assessment & Plan Note (Signed)
I have personally reviewed the Medicare Annual Wellness questionnaire and have noted 1. The patient's medical and social history 2. Their use of alcohol, tobacco or illicit drugs 3. Their current medications and supplements 4. The patient's functional ability including ADL's, fall risks, home safety risks and hearing or visual             impairment. 5. Diet and physical activities 6. Evidence for depression or mood disorders  The patients weight, height, BMI and visual acuity have been recorded in the chart I have made referrals, counseling and provided education to the patient based review of the above and I have provided the pt with a written personalized care plan for preventive services.  I have provided you with a copy of your personalized plan for preventive services. Please take the time to review along with your updated medication list.  No cancer screening due to age Trying to do some exercise Prefers no immunizations

## 2022-03-08 NOTE — Assessment & Plan Note (Signed)
Is taking famotidine 20 mg bid

## 2022-03-08 NOTE — Progress Notes (Signed)
Subjective:    Patient ID: Sherry Phillips, female    DOB: [birth year], 88 y.o.   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  No falls in past year No depression or anhedonia Independent with instrumental ADLs No sig memory issues  :I think I'm coming apart" Having sinus/allergy symptoms for a while--meds not helping Still on allegra  Arthritis bad--trouble walking at times Using tylenol Had bad muscle aches with the atorvastatin--but seems to be better with just twice a week  This morning--saw some blood in urine Did have testing in [place] a year ago or so Is still on the aspirin daily  Is on the famotidine 20 bid No heartburn No dysphagia  Reviewed last labs GFR 41  Current Outpatient Medications on File Prior to Visit  Medication Sig Dispense Refill   aspirin EC 81 MG EC tablet Take 1 tablet (81 mg total) by mouth daily. Swallow whole. 30 tablet 11   atorvastatin (LIPITOR) 40 MG tablet Take 40 mg by mouth 2 (two) times a week.     famotidine (PEPCID) 20 MG tablet Take 20 mg by mouth 2 (two) times daily.     fexofenadine (ALLEGRA) 180 MG tablet Take 180 mg by mouth daily as needed.      Multiple Vitamins-Minerals (PRESERVISION AREDS 2) CAPS Take by mouth daily.     tiZANidine (ZANAFLEX) 2 MG tablet Take 1-2 tablets (2-4 mg total) by mouth at bedtime as needed for muscle spasms. 30 tablet 0   vitamin B-12 (CYANOCOBALAMIN) 1000 MCG tablet Take 1,000 mcg by mouth every other day.     No current facility-administered medications on file prior to visit.    Allergies   Allergen Reactions   Egg White [Egg White (Egg Protein)] Rash    Past Medical History:  Diagnosis Date   GERD (gastroesophageal reflux disease)    Hiatal hernia    Hyperlipidemia    Osteoarthritis     Past Surgical History:  Procedure Laterality Date   APPENDECTOMY  1962   with ruptured ovary repair   BREAST BIOPSY  1970   CATARACT EXTRACTION     both eyes   ROBOTIC ASSISTED LAPAROSCOPIC REPAIR OF PARAESOPHAGEAL HERNIA  01/2017   Dr Grandville Silos at [place] History  Problem Relation Age of Onset   Hypertension Mother    Leukemia Mother    COPD Father    Diabetes Sister        borderline   Coronary artery disease Brother    Lung disease Brother    Cancer Neg Hx        no breast or colon cancer    Social History   Socioeconomic History   Marital status: Widowed    Spouse name: Not on file   Number of children: 2   Years of education: Not on file   Highest education level: Not on file  Occupational History   Occupation: homemaker  Tobacco Use   Smoking status: Never   Smokeless tobacco: Never  Vaping Use   Vaping Use: Never used  Substance and Sexual Activity   Alcohol use: No   Drug use: No   Sexual activity: Never  Other Topics Concern   Not on file  Social History Narrative   Widowed 2015      One son and one daughter, [name]   Has living will.  Requests daughter to make decisions if needed.    Would accept CPR at this point, but no prolonged artificial ventilation.     Would not want tube feeds.   Social Determinants of Health   Financial Resource Strain: Not on file  Food Insecurity: Not on file  Transportation Needs: Not on file  Physical Activity: Not on file  Stress: Not on file  Social Connections: Not on file  Intimate Partner Violence: Not on file   Review of Systems Appetite is okay Weight is stable Sleeps okay Wears seat belt Teeth are okay---keeps up with dentist No suspicious skin lesions---has spot on left calf  that is fading some (was checked by derm)--will give TAC to try Bowels move fine--no blood No chest pain or SOB Occasional dizziness---if she turns too quick. No syncope Occ mild edema--better overnight     Objective:   Physical Exam Constitutional:      Appearance: Normal appearance.  HENT:     Mouth/Throat:     Comments: No lesions Eyes:     Conjunctiva/sclera: Conjunctivae normal.     Pupils: Pupils are equal, round, and reactive to light.  Cardiovascular:     Rate and Rhythm: Normal rate and regular rhythm.     Pulses: Normal pulses.     Heart sounds: No murmur heard.    No gallop.  Pulmonary:     Effort: Pulmonary effort is normal.     Breath sounds: Normal breath sounds. No wheezing or rales.  Abdominal:     Palpations: Abdomen is soft.     Tenderness: There is no abdominal tenderness.  Musculoskeletal:     Cervical back: Neck supple.     Right lower leg: No edema.     Left lower leg: No edema.  Lymphadenopathy:     Cervical: No cervical adenopathy.  Skin:    Findings: No rash.     Comments: 3cm circular lesion on left calf  Neurological:     General: No focal deficit present.     Mental Status: She is alert and oriented to person, place, and time.     Comments: Mini--cog okay---clock good, recall 2/3  Psychiatric:        Mood and Affect: Mood normal.        Behavior: Behavior normal.            Assessment & Plan:

## 2022-03-14 ENCOUNTER — Other Ambulatory Visit (INDEPENDENT_AMBULATORY_CARE_PROVIDER_SITE_OTHER): Payer: Medicare Other

## 2022-03-14 ENCOUNTER — Encounter: Payer: Self-pay | Admitting: Internal Medicine

## 2022-03-14 DIAGNOSIS — K2100 Gastro-esophageal reflux disease with esophagitis, without bleeding: Secondary | ICD-10-CM

## 2022-03-14 DIAGNOSIS — Z8673 Personal history of transient ischemic attack (TIA), and cerebral infarction without residual deficits: Secondary | ICD-10-CM

## 2022-03-14 DIAGNOSIS — E538 Deficiency of other specified B group vitamins: Secondary | ICD-10-CM

## 2022-03-14 DIAGNOSIS — N1832 Chronic kidney disease, stage 3b: Secondary | ICD-10-CM

## 2022-03-14 DIAGNOSIS — R310 Gross hematuria: Secondary | ICD-10-CM | POA: Diagnosis not present

## 2022-03-14 LAB — RENAL FUNCTION PANEL
Albumin: 3.9 g/dL (ref 3.5–5.2)
BUN: 15 mg/dL (ref 6–23)
CO2: 30 mEq/L (ref 19–32)
Calcium: 9.6 mg/dL (ref 8.4–10.5)
Chloride: 104 mEq/L (ref 96–112)
Creatinine, Ser: 1.22 mg/dL — ABNORMAL HIGH (ref 0.40–1.20)
GFR: 40.13 mL/min — ABNORMAL LOW (ref >60.00)
Glucose, Bld: 101 mg/dL — ABNORMAL HIGH (ref 70–99)
Phosphorus: 3.3 mg/dL (ref 2.3–4.6)
Potassium: 4.5 mEq/L (ref 3.5–5.1)
Sodium: 142 mEq/L (ref 135–145)

## 2022-03-14 LAB — LIPID PANEL
Cholesterol: 234 mg/dL — ABNORMAL HIGH (ref 0–200)
HDL: 44.1 mg/dL (ref >39.00)
NonHDL: 190.03
Total CHOL/HDL Ratio: 5
Triglycerides: 235 mg/dL — ABNORMAL HIGH (ref 0.0–149.0)
VLDL: 47 mg/dL — ABNORMAL HIGH (ref 0.0–40.0)

## 2022-03-14 LAB — CBC
HCT: 43.1 % (ref 36.0–46.0)
Hemoglobin: 14.2 g/dL (ref 12.0–15.0)
MCHC: 32.9 g/dL (ref 30.0–36.0)
MCV: 88.4 fl (ref 78.0–100.0)
Platelets: 293 10*3/uL (ref 150.0–400.0)
RBC: 4.88 Mil/uL (ref 3.87–5.11)
RDW: 15.1 % (ref 11.5–15.5)
WBC: 7.5 10*3/uL (ref 4.0–10.5)

## 2022-03-14 LAB — HEPATIC FUNCTION PANEL
ALT: 9 U/L (ref 0–35)
AST: 14 U/L (ref 0–37)
Albumin: 3.9 g/dL (ref 3.5–5.2)
Alkaline Phosphatase: 108 U/L (ref 39–117)
Bilirubin, Direct: 0.1 mg/dL (ref 0.0–0.3)
Total Bilirubin: 0.6 mg/dL (ref 0.2–1.2)
Total Protein: 6.5 g/dL (ref 6.0–8.3)

## 2022-03-14 LAB — VITAMIN D 25 HYDROXY (VIT D DEFICIENCY, FRACTURES): VITD: 54.81 ng/mL (ref 30.00–100.00)

## 2022-03-14 LAB — VITAMIN B12: Vitamin B-12: 697 pg/mL (ref 211–911)

## 2022-03-14 LAB — LDL CHOLESTEROL, DIRECT: Direct LDL: 144 mg/dL

## 2022-03-15 LAB — PARATHYROID HORMONE, INTACT (NO CA): PTH: 36 pg/mL (ref 16–77)

## 2022-03-22 DIAGNOSIS — H353211 Exudative age-related macular degeneration, right eye, with active choroidal neovascularization: Secondary | ICD-10-CM | POA: Diagnosis not present

## 2022-04-04 MED ORDER — ROSUVASTATIN CALCIUM 5 MG PO TABS: 5.0000 mg | ORAL_TABLET | Freq: Every day | ORAL | 3 refills | Status: DC

## 2022-04-19 DIAGNOSIS — H353221 Exudative age-related macular degeneration, left eye, with active choroidal neovascularization: Secondary | ICD-10-CM | POA: Diagnosis not present

## 2022-05-17 DIAGNOSIS — H353211 Exudative age-related macular degeneration, right eye, with active choroidal neovascularization: Secondary | ICD-10-CM | POA: Diagnosis not present

## 2022-06-21 DIAGNOSIS — H353221 Exudative age-related macular degeneration, left eye, with active choroidal neovascularization: Secondary | ICD-10-CM | POA: Diagnosis not present

## 2022-07-12 DIAGNOSIS — H353211 Exudative age-related macular degeneration, right eye, with active choroidal neovascularization: Secondary | ICD-10-CM | POA: Diagnosis not present

## 2022-08-07 ENCOUNTER — Encounter: Payer: Self-pay | Admitting: Internal Medicine

## 2022-08-07 NOTE — Telephone Encounter (Signed)
Please see mychart message. Tried to call patient and her daughter and unable to reach. Sent a mychart message to call the office ASAP.

## 2022-08-08 DIAGNOSIS — D519 Vitamin B12 deficiency anemia, unspecified: Secondary | ICD-10-CM | POA: Diagnosis not present

## 2022-08-08 DIAGNOSIS — Z7982 Long term (current) use of aspirin: Secondary | ICD-10-CM | POA: Diagnosis not present

## 2022-08-08 DIAGNOSIS — H9190 Unspecified hearing loss, unspecified ear: Secondary | ICD-10-CM | POA: Diagnosis not present

## 2022-08-08 DIAGNOSIS — E538 Deficiency of other specified B group vitamins: Secondary | ICD-10-CM | POA: Diagnosis not present

## 2022-08-08 DIAGNOSIS — Z806 Family history of leukemia: Secondary | ICD-10-CM | POA: Diagnosis not present

## 2022-08-08 DIAGNOSIS — K921 Melena: Secondary | ICD-10-CM | POA: Diagnosis not present

## 2022-08-08 DIAGNOSIS — K219 Gastro-esophageal reflux disease without esophagitis: Secondary | ICD-10-CM | POA: Diagnosis not present

## 2022-08-08 DIAGNOSIS — R109 Unspecified abdominal pain: Secondary | ICD-10-CM | POA: Diagnosis not present

## 2022-08-08 DIAGNOSIS — E785 Hyperlipidemia, unspecified: Secondary | ICD-10-CM | POA: Diagnosis not present

## 2022-08-08 DIAGNOSIS — M810 Age-related osteoporosis without current pathological fracture: Secondary | ICD-10-CM | POA: Diagnosis not present

## 2022-08-08 DIAGNOSIS — M549 Dorsalgia, unspecified: Secondary | ICD-10-CM | POA: Diagnosis not present

## 2022-08-08 DIAGNOSIS — N183 Chronic kidney disease, stage 3 unspecified: Secondary | ICD-10-CM | POA: Diagnosis not present

## 2022-08-08 DIAGNOSIS — Z79899 Other long term (current) drug therapy: Secondary | ICD-10-CM | POA: Diagnosis not present

## 2022-08-08 DIAGNOSIS — Z8249 Family history of ischemic heart disease and other diseases of the circulatory system: Secondary | ICD-10-CM | POA: Diagnosis not present

## 2022-08-08 DIAGNOSIS — H3530 Unspecified macular degeneration: Secondary | ICD-10-CM | POA: Diagnosis not present

## 2022-08-08 DIAGNOSIS — L570 Actinic keratosis: Secondary | ICD-10-CM | POA: Diagnosis not present

## 2022-08-08 DIAGNOSIS — Z8262 Family history of osteoporosis: Secondary | ICD-10-CM | POA: Diagnosis not present

## 2022-08-08 DIAGNOSIS — K259 Gastric ulcer, unspecified as acute or chronic, without hemorrhage or perforation: Secondary | ICD-10-CM | POA: Diagnosis not present

## 2022-08-08 DIAGNOSIS — Z01818 Encounter for other preprocedural examination: Secondary | ICD-10-CM | POA: Diagnosis not present

## 2022-08-08 NOTE — Telephone Encounter (Signed)
She will likely bring her to [facility] [employer] [employer] where she works Will await their findings

## 2022-08-08 NOTE — Telephone Encounter (Signed)
Patients daughter returned call back and stated that she is going to take her mom over to the emergency room to be evaluated.

## 2022-08-09 DIAGNOSIS — K922 Gastrointestinal hemorrhage, unspecified: Secondary | ICD-10-CM | POA: Diagnosis not present

## 2022-08-09 DIAGNOSIS — K921 Melena: Secondary | ICD-10-CM | POA: Diagnosis not present

## 2022-08-14 ENCOUNTER — Encounter: Payer: Self-pay | Admitting: Internal Medicine

## 2022-08-14 ENCOUNTER — Ambulatory Visit: Payer: Medicare Other | Admitting: Internal Medicine

## 2022-08-14 ENCOUNTER — Other Ambulatory Visit (INDEPENDENT_AMBULATORY_CARE_PROVIDER_SITE_OTHER): Payer: Medicare Other

## 2022-08-14 ENCOUNTER — Ambulatory Visit (INDEPENDENT_AMBULATORY_CARE_PROVIDER_SITE_OTHER): Payer: Medicare Other | Admitting: Internal Medicine

## 2022-08-14 ENCOUNTER — Other Ambulatory Visit (INDEPENDENT_AMBULATORY_CARE_PROVIDER_SITE_OTHER): Payer: Medicare Other | Admitting: Internal Medicine

## 2022-08-14 VITALS — BP 128/60 | HR 70 | Temp 97.4°F | Ht 61.0 in | Wt 147.0 lb

## 2022-08-14 DIAGNOSIS — Z8673 Personal history of transient ischemic attack (TIA), and cerebral infarction without residual deficits: Secondary | ICD-10-CM | POA: Diagnosis not present

## 2022-08-14 DIAGNOSIS — K922 Gastrointestinal hemorrhage, unspecified: Secondary | ICD-10-CM | POA: Diagnosis not present

## 2022-08-14 DIAGNOSIS — K921 Melena: Secondary | ICD-10-CM

## 2022-08-14 DIAGNOSIS — N1832 Chronic kidney disease, stage 3b: Secondary | ICD-10-CM

## 2022-08-14 LAB — CBC
HCT: 31.8 % — ABNORMAL LOW (ref 36.0–46.0)
Hemoglobin: 10.5 g/dL — ABNORMAL LOW (ref 12.0–15.0)
MCHC: 33.1 g/dL (ref 30.0–36.0)
MCV: 89.2 fl (ref 78.0–100.0)
Platelets: 286 10*3/uL (ref 150.0–400.0)
RBC: 3.56 Mil/uL — ABNORMAL LOW (ref 3.87–5.11)
RDW: 15.4 % (ref 11.5–15.5)
WBC: 8.5 10*3/uL (ref 4.0–10.5)

## 2022-08-14 LAB — RENAL FUNCTION PANEL
Albumin: 3.6 g/dL (ref 3.5–5.2)
BUN: 12 mg/dL (ref 6–23)
CO2: 28 mEq/L (ref 19–32)
Calcium: 8.9 mg/dL (ref 8.4–10.5)
Chloride: 106 mEq/L (ref 96–112)
Creatinine, Ser: 1.26 mg/dL — ABNORMAL HIGH (ref 0.40–1.20)
GFR: 38.5 mL/min — ABNORMAL LOW (ref >60.00)
Glucose, Bld: 116 mg/dL — ABNORMAL HIGH (ref 70–99)
Phosphorus: 3.6 mg/dL (ref 2.3–4.6)
Potassium: 4.4 mEq/L (ref 3.5–5.1)
Sodium: 142 mEq/L (ref 135–145)

## 2022-08-14 NOTE — Progress Notes (Signed)
Patient's daughter notified as instructed by telephone. Patient scheduled for an appointment today 08/14/22 with Dr. Alphonsus Sias at 11:45 am.

## 2022-08-14 NOTE — Assessment & Plan Note (Signed)
Will recheck labs 

## 2022-08-14 NOTE — Progress Notes (Signed)
She really needs a follow up appointment ---not just labs. Okay to draw----Shannon, set her up later this week or add her on today

## 2022-08-14 NOTE — Progress Notes (Signed)
Subjective:    Patient ID: Sherry Phillips, female    DOB: December 03, 1933, 89 y.o.   MRN: [id number]  HPI Here for hospital follow up  Noticed black tarry stools---taken to ER Also had some fresh red blood Admitted overnight at [facility] Nadir hemoglobin 11.3 (from over 13) EGD showed gastritis--but no active bleeding Esophagus and duodenum appeared normal No biopsies taken Taken off the aspirin Off famotidine and now on pantoprazole  No recurrence of dark stool  Current Outpatient Medications on File Prior to Visit  Medication Sig Dispense Refill   cholecalciferol (VITAMIN D3) 25 MCG (1000 UNIT) tablet Take 1,000 Units by mouth daily.     fexofenadine (ALLEGRA) 180 MG tablet Take 180 mg by mouth daily as needed.      Multiple Vitamins-Minerals (PRESERVISION AREDS 2) CAPS Take by mouth daily.     pantoprazole (PROTONIX) 40 MG tablet Take 1 tablet by mouth daily.     rosuvastatin (CRESTOR) 5 MG tablet Take 1 tablet (5 mg total) by mouth daily. 90 tablet 3   triamcinolone cream (KENALOG) 0.1 % Apply 1 Application topically 2 (two) times daily as needed. 45 g 1   vitamin B-12 (CYANOCOBALAMIN) 1000 MCG tablet Take 1,000 mcg by mouth every other day.     No current facility-administered medications on file prior to visit.    Allergies  Allergen Reactions   Egg White [Egg White (Egg Protein)] Rash    Past Medical History:  Diagnosis Date   GERD (gastroesophageal reflux disease)    Hiatal hernia    Hyperlipidemia    Osteoarthritis     Past Surgical History:  Procedure Laterality Date   APPENDECTOMY  1962   with ruptured ovary repair   BREAST BIOPSY  1970   CATARACT EXTRACTION     both eyes   ROBOTIC ASSISTED LAPAROSCOPIC REPAIR OF PARAESOPHAGEAL HERNIA  01/2017   Dr Janee Morn at [facility]    Family History  Problem Relation Age of Onset   Hypertension Mother    Leukemia Mother    COPD Father    Diabetes Sister        borderline   Coronary artery disease Brother     Lung disease Brother    Cancer Neg Hx        no breast or colon cancer    Social History   Socioeconomic History   Marital status: Widowed    Spouse name: Not on file   Number of children: 2   Years of education: Not on file   Highest education level: Not on file  Occupational History   Occupation: homemaker  Tobacco Use   Smoking status: Never   Smokeless tobacco: Never  Vaping Use   Vaping Use: Never used  Substance and Sexual Activity   Alcohol use: No   Drug use: No   Sexual activity: Never  Other Topics Concern   Not on file  Social History Narrative   Widowed 64      One son and one daughter, Albertine Patricia   Has living will.  Requests daughter to make decisions if needed.    Would accept CPR at this point, but no prolonged artificial ventilation.     Would not want tube feeds.   Social Determinants of Health   Financial Resource Strain: Not on file  Food Insecurity: Not on file  Transportation Needs: Not on file  Physical Activity: Not on file  Stress: Not on file  Social Connections: Not on file  Intimate  Partner Violence: Not on file   Review of Systems No iron supplements or pepto bismol Appetite is fine Weight stable    Objective:   Physical Exam Constitutional:      Appearance: Normal appearance.  Cardiovascular:     Rate and Rhythm: Normal rate and regular rhythm.     Heart sounds: No murmur heard.    No gallop.  Pulmonary:     Effort: Pulmonary effort is normal.     Breath sounds: Normal breath sounds. No wheezing or rales.  Abdominal:     General: Bowel sounds are normal.     Palpations: Abdomen is soft. There is no mass.     Tenderness: There is no abdominal tenderness. There is no guarding.  Musculoskeletal:     Cervical back: Neck supple.     Right lower leg: No edema.     Left lower leg: No edema.  Lymphadenopathy:     Cervical: No cervical adenopathy.  Neurological:     Mental Status: She is alert.             Assessment & Plan:

## 2022-08-14 NOTE — Assessment & Plan Note (Signed)
EGD showed some gastritis but no active bleeding Presumed from the ASA---which she is now off Now on the pantoprazole---will continue Check labs

## 2022-08-14 NOTE — Assessment & Plan Note (Signed)
Will continue the rosuvastatin--but stop the aspirin

## 2022-08-23 DIAGNOSIS — H353221 Exudative age-related macular degeneration, left eye, with active choroidal neovascularization: Secondary | ICD-10-CM | POA: Diagnosis not present

## 2022-09-13 DIAGNOSIS — H353211 Exudative age-related macular degeneration, right eye, with active choroidal neovascularization: Secondary | ICD-10-CM | POA: Diagnosis not present

## 2022-10-25 DIAGNOSIS — H353221 Exudative age-related macular degeneration, left eye, with active choroidal neovascularization: Secondary | ICD-10-CM | POA: Diagnosis not present

## 2022-11-22 DIAGNOSIS — H353211 Exudative age-related macular degeneration, right eye, with active choroidal neovascularization: Secondary | ICD-10-CM | POA: Diagnosis not present

## 2022-12-27 DIAGNOSIS — H903 Sensorineural hearing loss, bilateral: Secondary | ICD-10-CM | POA: Diagnosis not present

## 2023-01-03 DIAGNOSIS — H353221 Exudative age-related macular degeneration, left eye, with active choroidal neovascularization: Secondary | ICD-10-CM | POA: Diagnosis not present

## 2023-02-07 DIAGNOSIS — H353211 Exudative age-related macular degeneration, right eye, with active choroidal neovascularization: Secondary | ICD-10-CM | POA: Diagnosis not present

## 2023-02-15 ENCOUNTER — Encounter: Payer: Self-pay | Admitting: Internal Medicine

## 2023-03-07 DIAGNOSIS — H353221 Exudative age-related macular degeneration, left eye, with active choroidal neovascularization: Secondary | ICD-10-CM | POA: Diagnosis not present

## 2023-03-14 ENCOUNTER — Encounter: Payer: Self-pay | Admitting: Internal Medicine

## 2023-03-14 ENCOUNTER — Other Ambulatory Visit (INDEPENDENT_AMBULATORY_CARE_PROVIDER_SITE_OTHER): Payer: Medicare Other

## 2023-03-14 ENCOUNTER — Ambulatory Visit: Payer: Medicare Other | Admitting: Internal Medicine

## 2023-03-14 VITALS — BP 124/70 | HR 80 | Temp 98.0°F | Ht 60.5 in | Wt 143.0 lb

## 2023-03-14 DIAGNOSIS — N1832 Chronic kidney disease, stage 3b: Secondary | ICD-10-CM

## 2023-03-14 DIAGNOSIS — K219 Gastro-esophageal reflux disease without esophagitis: Secondary | ICD-10-CM | POA: Diagnosis not present

## 2023-03-14 DIAGNOSIS — H353230 Exudative age-related macular degeneration, bilateral, stage unspecified: Secondary | ICD-10-CM

## 2023-03-14 DIAGNOSIS — Z0000 Encounter for general adult medical examination without abnormal findings: Secondary | ICD-10-CM | POA: Diagnosis not present

## 2023-03-14 DIAGNOSIS — Z8673 Personal history of transient ischemic attack (TIA), and cerebral infarction without residual deficits: Secondary | ICD-10-CM

## 2023-03-14 DIAGNOSIS — E538 Deficiency of other specified B group vitamins: Secondary | ICD-10-CM

## 2023-03-14 DIAGNOSIS — M150 Primary generalized (osteo)arthritis: Secondary | ICD-10-CM

## 2023-03-14 LAB — CBC
HCT: 44.5 % (ref 36.0–46.0)
Hemoglobin: 14.5 g/dL (ref 12.0–15.0)
MCHC: 32.6 g/dL (ref 30.0–36.0)
MCV: 91.8 fL (ref 78.0–100.0)
Platelets: 251 10*3/uL (ref 150.0–400.0)
RBC: 4.85 Mil/uL (ref 3.87–5.11)
RDW: 14.9 % (ref 11.5–15.5)
WBC: 8.6 10*3/uL (ref 4.0–10.5)

## 2023-03-14 LAB — HEPATIC FUNCTION PANEL
ALT: 10 U/L (ref 0–35)
AST: 15 U/L (ref 0–37)
Albumin: 3.9 g/dL (ref 3.5–5.2)
Alkaline Phosphatase: 89 U/L (ref 39–117)
Bilirubin, Direct: 0.1 mg/dL (ref 0.0–0.3)
Total Bilirubin: 0.6 mg/dL (ref 0.2–1.2)
Total Protein: 6.2 g/dL (ref 6.0–8.3)

## 2023-03-14 LAB — RENAL FUNCTION PANEL
Albumin: 3.9 g/dL (ref 3.5–5.2)
BUN: 16 mg/dL (ref 6–23)
CO2: 30 meq/L (ref 19–32)
Calcium: 9.3 mg/dL (ref 8.4–10.5)
Chloride: 105 meq/L (ref 96–112)
Creatinine, Ser: 1.2 mg/dL (ref 0.40–1.20)
GFR: 40.65 mL/min — ABNORMAL LOW (ref >60.00)
Glucose, Bld: 125 mg/dL — ABNORMAL HIGH (ref 70–99)
Phosphorus: 3.5 mg/dL (ref 2.3–4.6)
Potassium: 4.1 meq/L (ref 3.5–5.1)
Sodium: 142 meq/L (ref 135–145)

## 2023-03-14 LAB — VITAMIN B12: Vitamin B-12: 474 pg/mL (ref 211–911)

## 2023-03-14 LAB — VITAMIN D 25 HYDROXY (VIT D DEFICIENCY, FRACTURES): VITD: 65.21 ng/mL (ref 30.00–100.00)

## 2023-03-14 LAB — LIPID PANEL
Cholesterol: 174 mg/dL (ref 0–200)
HDL: 46.4 mg/dL (ref >39.00)
LDL Cholesterol: 94 mg/dL (ref 0–99)
NonHDL: 128.05
Total CHOL/HDL Ratio: 4
Triglycerides: 169 mg/dL — ABNORMAL HIGH (ref 0.0–149.0)
VLDL: 33.8 mg/dL (ref 0.0–40.0)

## 2023-03-14 NOTE — Addendum Note (Signed)
Addended by: Alvina Chou on: 03/14/2023 09:19 AM   Modules accepted: Orders

## 2023-03-14 NOTE — Assessment & Plan Note (Signed)
Does well with tylenol arthritis bid

## 2023-03-14 NOTE — Assessment & Plan Note (Signed)
Getting injections still in both eyes

## 2023-03-14 NOTE — Progress Notes (Signed)
Subjective:    Patient ID: Sherry Phillips, female    DOB: Aug 12, 1933, 89 y.o.   MRN: [id number]  HPI Here for Medicare wellness visit and follow up of chronic health conditions Reviewed advanced directives Reviewed other doctors---Dr King--ophthal, [facility] audiology, Dr Sunday Spillers, [facility] dentist Vision still not good--continues on injections for macular degeneration (trouble with small print) Hearing loss persists--has aide for left--but doesn't help much No alcohol or tobacco  No falls in past year Does some exercise---mows lawn, uses foot pedal machine No depression or anhedonia Independent with instrumental ADLs Some memory issues---no functional changes  Was admitted once for GI at [facility] in May EGD showed gastric erosions and aspirin was stopped Sent home on PPI----but then changed back to famotidine 20mg  bid No heartburn or dysphagia on this  GFR ranging from 38-41 over the past 2 years  Ongoing arthritis ---uses tylenol arthritis bid This helps a fair bit  Current Outpatient Medications on File Prior to Visit  Medication Sig Dispense Refill   acetaminophen (TYLENOL) 650 MG CR tablet Take 650 mg by mouth every 8 (eight) hours as needed for pain.     cholecalciferol (VITAMIN D3) 25 MCG (1000 UNIT) tablet Take 1,000 Units by mouth daily.     famotidine (PEPCID) 20 MG tablet Take 20 mg by mouth 2 (two) times daily.     fexofenadine (ALLEGRA) 180 MG tablet Take 180 mg by mouth daily as needed.      Multiple Vitamins-Minerals (PRESERVISION AREDS 2) CAPS Take by mouth daily.     rosuvastatin (CRESTOR) 5 MG tablet Take 1 tablet (5 mg total) by mouth daily. 90 tablet 3   triamcinolone cream (KENALOG) 0.1 % Apply 1 Application topically 2 (two) times daily as needed. 45 g 1   vitamin B-12 (CYANOCOBALAMIN) 1000 MCG tablet Take 1,000 mcg by mouth every other day.     No current facility-administered medications on file prior to visit.    Allergies  Allergen Reactions    Egg White [Egg White (Egg Protein)] Rash    Past Medical History:  Diagnosis Date   GERD (gastroesophageal reflux disease)    Hiatal hernia    Hyperlipidemia    Osteoarthritis     Past Surgical History:  Procedure Laterality Date   APPENDECTOMY  1962   with ruptured ovary repair   BREAST BIOPSY  1970   CATARACT EXTRACTION     both eyes   ROBOTIC ASSISTED LAPAROSCOPIC REPAIR OF PARAESOPHAGEAL HERNIA  01/2017   Dr Janee Morn at [facility]    Family History  Problem Relation Age of Onset   Hypertension Mother    Leukemia Mother    COPD Father    Diabetes Sister        borderline   Coronary artery disease Brother    Lung disease Brother    Cancer Neg Hx        no breast or colon cancer    Social History   Socioeconomic History   Marital status: Widowed    Spouse name: Not on file   Number of children: 2   Years of education: Not on file   Highest education level: Not on file  Occupational History   Occupation: homemaker  Tobacco Use   Smoking status: Never   Smokeless tobacco: Never  Vaping Use   Vaping status: Never Used  Substance and Sexual Activity   Alcohol use: No   Drug use: No   Sexual activity: Never  Other Topics Concern  Not on file  Social History Narrative   Widowed 2015      One son and one daughter, Albertine Patricia   Has living will.  Requests daughter to make decisions if needed.    Would accept CPR at this point, but no prolonged artificial ventilation.     Would not want tube feeds.   Social Drivers of Corporate investment banker Strain: Low Risk  (08/09/2022)   Received from [facility], [facility]   Overall Financial Resource Strain (CARDIA)    Difficulty of Paying Living Expenses: Not hard at all  Food Insecurity: No Food Insecurity (08/09/2022)   Received from [facility], [facility]   Hunger Vital Sign    Worried About Running Out of Food in the Last Year: Never true    Ran Out of Food in the Last Year: Never true   Transportation Needs: No Transportation Needs (08/09/2022)   Received from [facility], [facility]   [facility] - Transportation    Lack of Transportation (Medical): No    Lack of Transportation (Non-Medical): No  Physical Activity: Not on file  Stress: Not on file  Social Connections: Not on file  Intimate Partner Violence: Not on file   Review of Systems Appetite is good Weight is stable Sleeps okay Wears seat belt Teeth are okay---keeps up with dentist No new suspicious lesions on skin---does see derm Bowels move okay--no blood No urinary symptoms No chest pain or SOB Had dizzy spell this week---motion symptoms (used OTC med with relief) No syncope    Objective:   Physical Exam Constitutional:      Appearance: Normal appearance.  HENT:     Mouth/Throat:     Pharynx: No oropharyngeal exudate or posterior oropharyngeal erythema.  Eyes:     Conjunctiva/sclera: Conjunctivae normal.     Pupils: Pupils are equal, round, and reactive to light.  Cardiovascular:     Rate and Rhythm: Normal rate and regular rhythm.     Pulses: Normal pulses.     Heart sounds: No murmur heard.    No gallop.  Pulmonary:     Effort: Pulmonary effort is normal.     Breath sounds: Normal breath sounds. No wheezing or rales.  Abdominal:     Palpations: Abdomen is soft.     Tenderness: There is no abdominal tenderness.  Musculoskeletal:     Cervical back: Neck supple.     Right lower leg: No edema.     Left lower leg: No edema.     Comments: Bilateral bunions  Lymphadenopathy:     Cervical: No cervical adenopathy.  Skin:    Findings: No rash.  Neurological:     General: No focal deficit present.     Mental Status: She is alert and oriented to person, place, and time.     Comments: Word naming---7/1 minute Recall 2/3  Psychiatric:        Mood and Affect: Mood normal.        Behavior: Behavior normal.            Assessment & Plan:

## 2023-03-14 NOTE — Progress Notes (Signed)
Hearing Screening - Comments:: HAs a hearing aid in the left ear. Deaf in right ear.  Vision Screening - Comments:: Every 8 weeks

## 2023-03-14 NOTE — Assessment & Plan Note (Signed)
Back on famotidine 20mg  bid (PPI briefly after upper GI bleed)

## 2023-03-14 NOTE — Assessment & Plan Note (Signed)
I have personally reviewed the Medicare Annual Wellness questionnaire and have noted 1. The patient's medical and social history 2. Their use of alcohol, tobacco or illicit drugs 3. Their current medications and supplements 4. The patient's functional ability including ADL's, fall risks, home safety risks and hearing or visual             impairment. 5. Diet and physical activities 6. Evidence for depression or mood disorders  The patients weight, height, BMI and visual acuity have been recorded in the chart I have made referrals, counseling and provided education to the patient based review of the above and I have provided the pt with a written personalized care plan for preventive services.  I have provided you with a copy of your personalized plan for preventive services. Please take the time to review along with your updated medication list.  Done with cancer screening Still prefers no vaccines Discussed exercise

## 2023-03-14 NOTE — Assessment & Plan Note (Signed)
Has been stable No Rx Will refer to nephrology if worsens

## 2023-03-14 NOTE — Assessment & Plan Note (Signed)
No new neurologic symptoms Continues on the rosuvastatin 5mg  daily Off aspirin due to GI bleed

## 2023-03-14 NOTE — Assessment & Plan Note (Signed)
Continues on supplement 

## 2023-03-15 LAB — PARATHYROID HORMONE, INTACT (NO CA): PTH: 29 pg/mL (ref 16–77)

## 2023-04-18 DIAGNOSIS — H353211 Exudative age-related macular degeneration, right eye, with active choroidal neovascularization: Secondary | ICD-10-CM | POA: Diagnosis not present

## 2023-05-18 DIAGNOSIS — H353221 Exudative age-related macular degeneration, left eye, with active choroidal neovascularization: Secondary | ICD-10-CM | POA: Diagnosis not present

## 2023-05-18 DIAGNOSIS — H43813 Vitreous degeneration, bilateral: Secondary | ICD-10-CM | POA: Diagnosis not present

## 2023-05-18 DIAGNOSIS — H353211 Exudative age-related macular degeneration, right eye, with active choroidal neovascularization: Secondary | ICD-10-CM | POA: Diagnosis not present

## 2023-05-18 DIAGNOSIS — H353222 Exudative age-related macular degeneration, left eye, with inactive choroidal neovascularization: Secondary | ICD-10-CM | POA: Diagnosis not present

## 2023-05-18 DIAGNOSIS — Z961 Presence of intraocular lens: Secondary | ICD-10-CM | POA: Diagnosis not present

## 2023-06-27 DIAGNOSIS — H353221 Exudative age-related macular degeneration, left eye, with active choroidal neovascularization: Secondary | ICD-10-CM | POA: Diagnosis not present

## 2023-08-01 DIAGNOSIS — H353221 Exudative age-related macular degeneration, left eye, with active choroidal neovascularization: Secondary | ICD-10-CM | POA: Diagnosis not present

## 2023-08-08 DIAGNOSIS — L821 Other seborrheic keratosis: Secondary | ICD-10-CM | POA: Diagnosis not present

## 2023-08-08 DIAGNOSIS — L565 Disseminated superficial actinic porokeratosis (DSAP): Secondary | ICD-10-CM | POA: Diagnosis not present

## 2023-09-12 DIAGNOSIS — H353221 Exudative age-related macular degeneration, left eye, with active choroidal neovascularization: Secondary | ICD-10-CM | POA: Diagnosis not present

## 2023-10-09 DIAGNOSIS — K219 Gastro-esophageal reflux disease without esophagitis: Secondary | ICD-10-CM | POA: Diagnosis not present

## 2023-10-09 DIAGNOSIS — M545 Low back pain, unspecified: Secondary | ICD-10-CM | POA: Diagnosis not present

## 2023-10-09 DIAGNOSIS — S32040A Wedge compression fracture of fourth lumbar vertebra, initial encounter for closed fracture: Secondary | ICD-10-CM | POA: Diagnosis not present

## 2023-10-09 DIAGNOSIS — S32040D Wedge compression fracture of fourth lumbar vertebra, subsequent encounter for fracture with routine healing: Secondary | ICD-10-CM | POA: Diagnosis not present

## 2023-10-09 DIAGNOSIS — R29890 Loss of height: Secondary | ICD-10-CM | POA: Diagnosis not present

## 2023-10-09 DIAGNOSIS — M47817 Spondylosis without myelopathy or radiculopathy, lumbosacral region: Secondary | ICD-10-CM | POA: Diagnosis not present

## 2023-10-09 DIAGNOSIS — N189 Chronic kidney disease, unspecified: Secondary | ICD-10-CM | POA: Diagnosis not present

## 2023-10-09 DIAGNOSIS — M8580 Other specified disorders of bone density and structure, unspecified site: Secondary | ICD-10-CM | POA: Diagnosis not present

## 2023-10-17 DIAGNOSIS — H353221 Exudative age-related macular degeneration, left eye, with active choroidal neovascularization: Secondary | ICD-10-CM | POA: Diagnosis not present

## 2023-10-19 ENCOUNTER — Encounter: Payer: Self-pay | Admitting: Internal Medicine

## 2023-10-24 ENCOUNTER — Ambulatory Visit (INDEPENDENT_AMBULATORY_CARE_PROVIDER_SITE_OTHER): Admitting: Internal Medicine

## 2023-10-24 ENCOUNTER — Encounter: Payer: Self-pay | Admitting: Internal Medicine

## 2023-10-24 VITALS — BP 120/70 | HR 74 | Temp 98.4°F | Ht 60.5 in | Wt 142.0 lb

## 2023-10-24 DIAGNOSIS — Z8673 Personal history of transient ischemic attack (TIA), and cerebral infarction without residual deficits: Secondary | ICD-10-CM | POA: Diagnosis not present

## 2023-10-24 MED ORDER — ROSUVASTATIN CALCIUM 5 MG PO TABS: 5.0000 mg | ORAL_TABLET | Freq: Every day | ORAL | 3 refills | Status: AC

## 2023-10-24 NOTE — Assessment & Plan Note (Signed)
 No new neurologic symptoms Will refill her rosuvastatin 

## 2023-10-24 NOTE — Progress Notes (Signed)
 Subjective:    Patient ID: Sherry Phillips, female    DOB: 1934-02-20, 89 y.o.   MRN: [id number]  HPI Here with to review medications Needs refill of rosuvastatin   No new neurologic symptoms No focal weakness No aphasia No facial droop  Fell 3 weeks ago--tripped after not lifting foot up enough Seen at [facility]--lumbar changes on x-ray but no acute injury No sig pain though  Current Outpatient Medications on File Prior to Visit  Medication Sig Dispense Refill   acetaminophen  (TYLENOL ) 650 MG CR tablet Take 650 mg by mouth every 8 (eight) hours as needed for pain.     cholecalciferol (VITAMIN D3) 25 MCG (1000 UNIT) tablet Take 1,000 Units by mouth daily.     famotidine  (PEPCID ) 20 MG tablet Take 20 mg by mouth 2 (two) times daily.     fexofenadine (ALLEGRA) 180 MG tablet Take 180 mg by mouth daily as needed.      Multiple Vitamins-Minerals (PRESERVISION AREDS 2) CAPS Take by mouth daily.     rosuvastatin  (CRESTOR ) 5 MG tablet Take 1 tablet (5 mg total) by mouth daily. 90 tablet 3   triamcinolone  cream (KENALOG ) 0.1 % Apply 1 Application topically 2 (two) times daily as needed. 45 g 1   vitamin B-12 (CYANOCOBALAMIN ) 1000 MCG tablet Take 1,000 mcg by mouth every other day.     No current facility-administered medications on file prior to visit.    Allergies  Allergen Reactions   Egg White [Egg White (Egg Protein)] Rash    Past Medical History:  Diagnosis Date   GERD (gastroesophageal reflux disease)    Hiatal hernia    Hyperlipidemia    Osteoarthritis     Past Surgical History:  Procedure Laterality Date   APPENDECTOMY  1962   with ruptured ovary repair   BREAST BIOPSY  1970   CATARACT EXTRACTION     both eyes   ROBOTIC ASSISTED LAPAROSCOPIC REPAIR OF PARAESOPHAGEAL HERNIA  01/2017   Dr Sebastian at [facility]    Family History  Problem Relation Age of Onset   Hypertension Mother    Leukemia Mother    COPD Father    Diabetes Sister        borderline   Coronary  artery disease Brother    Lung disease Brother    Cancer Neg Hx        no breast or colon cancer    Social History   Socioeconomic History   Marital status: Widowed    Spouse name: Not on file   Number of children: 2   Years of education: Not on file   Highest education level: Not on file  Occupational History   Occupation: homemaker  Tobacco Use   Smoking status: Never   Smokeless tobacco: Never  Vaping Use   Vaping status: Never Used  Substance and Sexual Activity   Alcohol use: No   Drug use: No   Sexual activity: Never  Other Topics Concern   Not on file  Social History Narrative   Widowed 38      One son and one daughter, Sonja Dechenne   Has living will.  Requests daughter to make decisions if needed.    Would accept CPR at this point, but no prolonged artificial ventilation.     Would not want tube feeds.   Social Drivers of Corporate investment banker Strain: Low Risk  (08/09/2022)   Received from [facility]   Overall Financial Resource Strain (CARDIA)  Difficulty of Paying Living Expenses: Not hard at all  Food Insecurity: No Food Insecurity (08/09/2022)   Received from [facility]   Hunger Vital Sign    Within the past 12 months, you worried that your food would run out before you got the money to buy more.: Never true    Within the past 12 months, the food you bought just didn't last and you didn't have money to get more.: Never true  Transportation Needs: No Transportation Needs (08/09/2022)   Received from [facility]   PRAPARE - Transportation    Lack of Transportation (Medical): No    Lack of Transportation (Non-Medical): No  Physical Activity: Not on file  Stress: Not on file  Social Connections: Not on file  Intimate Partner Violence: Not on file   Review of Systems Sleeps fine Appetite is fine     Objective:   Physical Exam Constitutional:      Appearance: Normal appearance.  Neurological:     General: No focal deficit  present.     Mental Status: She is alert.  Psychiatric:        Mood and Affect: Mood normal.        Behavior: Behavior normal.            Assessment & Plan:

## 2023-11-13 DIAGNOSIS — H353221 Exudative age-related macular degeneration, left eye, with active choroidal neovascularization: Secondary | ICD-10-CM | POA: Diagnosis not present

## 2023-11-13 DIAGNOSIS — H5203 Hypermetropia, bilateral: Secondary | ICD-10-CM | POA: Diagnosis not present

## 2023-11-13 DIAGNOSIS — H353112 Nonexudative age-related macular degeneration, right eye, intermediate dry stage: Secondary | ICD-10-CM | POA: Diagnosis not present

## 2023-11-13 DIAGNOSIS — H26493 Other secondary cataract, bilateral: Secondary | ICD-10-CM | POA: Diagnosis not present

## 2023-11-28 DIAGNOSIS — H353221 Exudative age-related macular degeneration, left eye, with active choroidal neovascularization: Secondary | ICD-10-CM | POA: Diagnosis not present

## 2024-01-02 DIAGNOSIS — H353221 Exudative age-related macular degeneration, left eye, with active choroidal neovascularization: Secondary | ICD-10-CM | POA: Diagnosis not present

## 2024-01-30 DIAGNOSIS — N1831 Chronic kidney disease, stage 3a: Secondary | ICD-10-CM | POA: Diagnosis not present

## 2024-01-30 DIAGNOSIS — Z131 Encounter for screening for diabetes mellitus: Secondary | ICD-10-CM | POA: Diagnosis not present

## 2024-01-30 DIAGNOSIS — Z7689 Persons encountering health services in other specified circumstances: Secondary | ICD-10-CM | POA: Diagnosis not present

## 2024-01-30 DIAGNOSIS — H353131 Nonexudative age-related macular degeneration, bilateral, early dry stage: Secondary | ICD-10-CM | POA: Diagnosis not present

## 2024-01-30 DIAGNOSIS — Z8673 Personal history of transient ischemic attack (TIA), and cerebral infarction without residual deficits: Secondary | ICD-10-CM | POA: Diagnosis not present

## 2024-01-30 DIAGNOSIS — E7800 Pure hypercholesterolemia, unspecified: Secondary | ICD-10-CM | POA: Diagnosis not present

## 2024-01-30 DIAGNOSIS — Z1329 Encounter for screening for other suspected endocrine disorder: Secondary | ICD-10-CM | POA: Diagnosis not present

## 2024-01-30 DIAGNOSIS — K219 Gastro-esophageal reflux disease without esophagitis: Secondary | ICD-10-CM | POA: Diagnosis not present

## 2024-01-30 DIAGNOSIS — Z130 Encounter for screening for diseases of the blood and blood-forming organs and certain disorders involving the immune mechanism: Secondary | ICD-10-CM | POA: Diagnosis not present

## 2024-02-27 DIAGNOSIS — H353211 Exudative age-related macular degeneration, right eye, with active choroidal neovascularization: Secondary | ICD-10-CM | POA: Diagnosis not present
